# Patient Record
Sex: Female | Born: 1965 | Race: White | Hispanic: No | Marital: Single | Smoking: Never smoker
Health system: Southern US, Community
[De-identification: ages and names within clinical notes are randomized; demographics above are authoritative.]

## PROBLEM LIST (undated history)

## (undated) DIAGNOSIS — F32A Depression, unspecified: Secondary | ICD-10-CM

## (undated) DIAGNOSIS — F329 Major depressive disorder, single episode, unspecified: Secondary | ICD-10-CM

## (undated) DIAGNOSIS — B019 Varicella without complication: Secondary | ICD-10-CM

## (undated) HISTORY — DX: Depression, unspecified: F32.A

## (undated) HISTORY — DX: Varicella without complication: B01.9

## (undated) HISTORY — PX: TONSILLECTOMY: SUR1361

## (undated) HISTORY — DX: Major depressive disorder, single episode, unspecified: F32.9

## (undated) HISTORY — PX: WISDOM TOOTH EXTRACTION: SHX21

---

## 2016-04-08 ENCOUNTER — Encounter: Payer: Self-pay | Admitting: Family Medicine

## 2016-04-08 ENCOUNTER — Ambulatory Visit (INDEPENDENT_AMBULATORY_CARE_PROVIDER_SITE_OTHER): Payer: PRIVATE HEALTH INSURANCE | Admitting: Family Medicine

## 2016-04-08 VITALS — BP 130/86 | HR 81 | Temp 98.3°F | Ht 61.5 in | Wt 188.4 lb

## 2016-04-08 DIAGNOSIS — R200 Anesthesia of skin: Secondary | ICD-10-CM

## 2016-04-08 DIAGNOSIS — R202 Paresthesia of skin: Secondary | ICD-10-CM

## 2016-04-08 DIAGNOSIS — Z131 Encounter for screening for diabetes mellitus: Secondary | ICD-10-CM

## 2016-04-08 DIAGNOSIS — Z130 Encounter for screening for diseases of the blood and blood-forming organs and certain disorders involving the immune mechanism: Secondary | ICD-10-CM

## 2016-04-08 DIAGNOSIS — Z13220 Encounter for screening for lipoid disorders: Secondary | ICD-10-CM

## 2016-04-08 DIAGNOSIS — Z1329 Encounter for screening for other suspected endocrine disorder: Secondary | ICD-10-CM | POA: Diagnosis not present

## 2016-04-08 DIAGNOSIS — E663 Overweight: Secondary | ICD-10-CM

## 2016-04-08 DIAGNOSIS — Z7689 Persons encountering health services in other specified circumstances: Secondary | ICD-10-CM

## 2016-04-08 LAB — LIPID PANEL
CHOL/HDL RATIO: 4
Cholesterol: 181 mg/dL (ref 0–200)
HDL: 44.1 mg/dL (ref >39.00)
LDL CALC: 125 mg/dL — AB (ref 0–99)
NonHDL: 136.41
TRIGLYCERIDES: 59 mg/dL (ref 0.0–149.0)
VLDL: 11.8 mg/dL (ref 0.0–40.0)

## 2016-04-08 LAB — COMPREHENSIVE METABOLIC PANEL
ALT: 24 U/L (ref 0–35)
AST: 19 U/L (ref 0–37)
Albumin: 3.9 g/dL (ref 3.5–5.2)
Alkaline Phosphatase: 53 U/L (ref 39–117)
BUN: 16 mg/dL (ref 6–23)
CO2: 28 meq/L (ref 19–32)
Calcium: 8.9 mg/dL (ref 8.4–10.5)
Chloride: 104 mEq/L (ref 96–112)
Creatinine, Ser: 0.94 mg/dL (ref 0.40–1.20)
GFR: 67.59 mL/min (ref >60.00)
GLUCOSE: 95 mg/dL (ref 70–99)
POTASSIUM: 3.4 meq/L — AB (ref 3.5–5.1)
Sodium: 139 mEq/L (ref 135–145)
Total Bilirubin: 1 mg/dL (ref 0.2–1.2)
Total Protein: 6.9 g/dL (ref 6.0–8.3)

## 2016-04-08 LAB — CBC
HEMATOCRIT: 42.6 % (ref 36.0–46.0)
HEMOGLOBIN: 14.8 g/dL (ref 12.0–15.0)
MCHC: 34.7 g/dL (ref 30.0–36.0)
MCV: 84.8 fl (ref 78.0–100.0)
Platelets: 294 10*3/uL (ref 150.0–400.0)
RBC: 5.02 Mil/uL (ref 3.87–5.11)
RDW: 12.9 % (ref 11.5–15.5)
WBC: 6.4 10*3/uL (ref 4.0–10.5)

## 2016-04-08 LAB — HEMOGLOBIN A1C: Hgb A1c MFr Bld: 5.7 % (ref 4.6–6.5)

## 2016-04-08 LAB — TSH: TSH: 1.78 u[IU]/mL (ref 0.35–4.50)

## 2016-04-08 NOTE — Progress Notes (Signed)
[place] at [facility] [address] [place], [place] [address] [phone]  Date:  04/08/2016   Name:  Jenna Mitchell   DOB:  [birth year]   MRN:  [id number]  PCP:  Lamar Blinks, MD    Chief Complaint: Establish Care (Pt here to est care.)   History of Present Illness:  Jenna Mitchell is a 50 y.o. very pleasant female patient who presents with the following:  Generally healthy woman here today as a new patient to establish care She is from [place]- moved here for a relationship and then stayed in the area when the relationship ended She is in the insurance field.    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 She has thought about coming off of this medication but he OBG suggested that she continue to take it.  She tried prozac, lexapro, but has done the best on this  She sees Dr. Adella Nissen for her OBG care; however she also wanted to establish with a PCP  She thinks her last tetanus was as a child.    Her mother has had MS for over 30 years and is overall doing well.   In her free time she enjoys reading, and walking her dog.  She has a Tree surgeon who she walks most days.  No CP or SOB with exercise For exercise she enjoys walking.  However admits she probably does not get enough exercise and would like to get more  She last had labs last year per her OBG. She started a new diet about 1 month ago and has lost 11 lbs by eating low carb.  She is fasting today for labs.   She did have an episode about a year ago where she felt kind of dizzy and "just fell," this has not occurred again,  She is not really sure what happened and she did not have any LOC.  She is not sure if she tripped or otherwise just fell. 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  She is not  interested in surgery at this time but would like to try any conservative treatment as indicated  There are no active problems to display for this patient.   Past Medical History:  Diagnosis Date  . Chicken pox   . Depression     Past Surgical History:  Procedure Laterality Date  . TONSILLECTOMY    . WISDOM TOOTH EXTRACTION      Social History  Substance Use Topics  . Smoking status: Never Smoker  . Smokeless tobacco: Never Used  . Alcohol use Yes     Comment: social     Family History  Problem Relation Age of Onset  . Multiple sclerosis Mother   . Depression Mother   . Heart attack Father     No Known Allergies  Medication list has been reviewed and updated.  No current outpatient prescriptions on file prior to visit.   No current facility-administered medications on file prior to visit.     Review of Systems:  As per HPI- otherwise negative. She is left handed She does spend a lot of time on the computer No fever, chills, SOB, CP or rash   Physical Examination: Vitals:   04/08/16 0912  BP: 130/86  Pulse: 81  Temp: 98.3 F (36.8 C)   Vitals:   04/08/16 0912  Weight: 188 lb 6.4 oz (85.5 kg)  Height: 5' 1.5" (1.562 m)   Body mass index is 35.02 kg/m. Ideal Body Weight: Weight in (lb) to have BMI = 25: 134.2  GEN: WDWN, NAD, Non-toxic, A & O x 3, overweight, otherwise looks well HEENT: Atraumatic, Normocephalic. Neck supple. No masses, No LAD.  Bilateral TM wnl, oropharynx normal.  PEERL,EOMI.   Ears and Nose: No external deformity. CV: RRR, No M/G/R. No JVD. No thrill. No extra heart sounds. PULM: CTA B, no wheezes, crackles, rhonchi. No retractions. No resp. distress. No accessory muscle use. ABD: S, NT, ND EXTR: No c/c/e NEURO Normal gait.  PSYCH: Normally interactive. Conversant. Not depressed or anxious appearing.  Calm demeanor.  normal strength of both hands. Normal ROM of both hands.     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 Encouraged he to continue her diet and weight loss plan Declines flu and tdap today- did encourage her to get indicated immunizations   Signed Lamar Blinks, MD

## 2016-04-08 NOTE — Progress Notes (Signed)
Pre visit review using our clinic review tool, if applicable. No additional management support is needed unless otherwise documented below in the visit note. 

## 2016-04-08 NOTE — Patient Instructions (Signed)
It was very nice to meet you today- I'll be in touch with your labs as asap You might try a carpal tunnel wrist brace on your left wrist while you sleep- this can help to decrease symptoms of carpal tunnel I do encourage you to have a tetanus booster (for sure if you have any significant wound) and an annual flu shot  Take care and let's plan to recheck in 6-9 months

## 2016-10-06 NOTE — Progress Notes (Signed)
[place] at [facility] [address] [place], [place] [address] [phone]  Date:  10/07/2016   Name:  Jenna Mitchell   DOB:  [birth year]   MRN:  [id number]  PCP:  Lamar Blinks, MD    Chief Complaint: Annual Exam (Last PAP: 03/2016, pt has GYN. Pt is fasting for labs. Declined tetanus vaccine. )   History of Present Illness:  Jenna Mitchell is a 51 y.o. very pleasant female patient who presents with the following:  Last seen by myself in October with the following partial HPI:  Generally healthy woman here today as a new patient to establish care She is from [place]- moved here for a relationship and then stayed in the area when the relationship ended She is in the insurance field.   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 She has thought about coming off of this medication but he OBG suggested that she continue to take it.  She tried prozac, lexapro, but has done the best on this She sees Dr. Adella Nissen for her OBG care; however she also wanted to establish with a PCP She thinks her last tetanus was as a child.    Her mother has had MS for over 30 years and is overall doing well.   In her free time she enjoys reading, and walking her dog.  She has a Tree surgeon who she walks most days.  No CP or SOB with exercise For exercise she enjoys walking.  However admits she probably does not get enough exercise and would like to get more  She last had labs last year per her OBG. She started a new diet about 1 month ago and has lost 11 lbs by eating low carb.   She had labs in November including CBC, lipids, A1c, TSH mammo was last year.,  Her pap is also UTD She reports that she is doing well She joined [employer] and is enjoying this.  She feels like she is getting into better shape.  She has tried to stay low carb, but she notes that she is having loose stools after eating She  would like to check her A1c today She defers her tdap today Wt Readings from Last 3 Encounters:  10/07/16 193 lb 6.4 oz (87.7 kg)  04/08/16 188 lb 6.4 oz (85.5 kg)   BP Readings from Last 3 Encounters:  10/07/16 114/76  04/08/16 130/86   Her cymbalta is working well for her and she wishes to continue her current dose   Patient Active Problem List   Diagnosis Date Noted  . Overweight 04/08/2016    Past Medical History:  Diagnosis Date  . Chicken pox   . Depression     Past Surgical History:  Procedure Laterality Date  . TONSILLECTOMY    . WISDOM TOOTH EXTRACTION      Social History  Substance Use Topics  . Smoking status: Never Smoker  . Smokeless tobacco: Never Used  . Alcohol use Yes     Comment: social     Family History  Problem Relation Age of Onset  . Multiple sclerosis Mother   . Depression Mother   . Heart attack Father     No Known Allergies  Medication list has been reviewed and updated.  Current Outpatient Prescriptions on File Prior to Visit  Medication Sig Dispense Refill  . DULoxetine (CYMBALTA) 60 MG capsule Take 60 mg by  mouth daily.    Marland Kitchen levonorgestrel-ethinyl estradiol (AVIANE,ALESSE,LESSINA) 0.1-20 MG-MCG tablet Take 1 tablet by mouth daily.     No current facility-administered medications on file prior to visit.     Review of Systems:  As per HPI- otherwise negative. She notes that her throat often feels "clogged" and she is coughing up some material mostly in the morning.  She has noted this for over a year Never a smoker She has not noted any PND  No concerns about breast or shin changes   Physical Examination: Vitals:   10/07/16 0832  BP: 114/76  Pulse: 80  Temp: 98.2 F (36.8 C)   Vitals:   10/07/16 0832  Weight: 193 lb 6.4 oz (87.7 kg)  Height: 5' 1.5" (1.562 m)   Body mass index is 35.95 kg/m. Ideal Body Weight: Weight in (lb) to have BMI = 25: 134.2  GEN: WDWN, NAD, Non-toxic, A & O x 3, overweight, otherwise  looks well HEENT: Atraumatic, Normocephalic. Neck supple. No masses, No LAD. Bilateral TM wnl, oropharynx normal.  PEERL,EOMI.   Ears and Nose: No external deformity. CV: RRR, No M/G/R. No JVD. No thrill. No extra heart sounds. PULM: CTA B, no wheezes, crackles, rhonchi. No retractions. No resp. distress. No accessory muscle use. ABD: S, NT, ND. No rebound. No HSM. EXTR: No c/c/e NEURO Normal gait.  PSYCH: Normally interactive. Conversant. Not depressed or anxious appearing.  Calm demeanor.  No unusual appearing moles on the skin of her trunk   Assessment and Plan: Physical exam - Plan: Basic metabolic panel  Screening for diabetes mellitus - Plan: Hemoglobin A1c  Overweight  Here today for a CPE Discussed health maint for her today Encouraged her to continue exercising and eating a healthy diet Will repeat her A1c and BMP today   Signed Lamar Blinks, MD

## 2016-10-07 ENCOUNTER — Ambulatory Visit (INDEPENDENT_AMBULATORY_CARE_PROVIDER_SITE_OTHER): Payer: PRIVATE HEALTH INSURANCE | Admitting: Family Medicine

## 2016-10-07 ENCOUNTER — Encounter: Payer: Self-pay | Admitting: Family Medicine

## 2016-10-07 VITALS — BP 114/76 | HR 80 | Temp 98.2°F | Ht 61.5 in | Wt 193.4 lb

## 2016-10-07 DIAGNOSIS — E663 Overweight: Secondary | ICD-10-CM | POA: Diagnosis not present

## 2016-10-07 DIAGNOSIS — Z131 Encounter for screening for diabetes mellitus: Secondary | ICD-10-CM

## 2016-10-07 DIAGNOSIS — Z0000 Encounter for general adult medical examination without abnormal findings: Secondary | ICD-10-CM

## 2016-10-07 LAB — BASIC METABOLIC PANEL
BUN: 16 mg/dL (ref 6–23)
CHLORIDE: 108 meq/L (ref 96–112)
CO2: 26 meq/L (ref 19–32)
CREATININE: 0.98 mg/dL (ref 0.40–1.20)
Calcium: 9.1 mg/dL (ref 8.4–10.5)
GFR: 64.29 mL/min (ref >60.00)
Glucose, Bld: 103 mg/dL — ABNORMAL HIGH (ref 70–99)
POTASSIUM: 4.2 meq/L (ref 3.5–5.1)
Sodium: 141 mEq/L (ref 135–145)

## 2016-10-07 LAB — HEMOGLOBIN A1C: HEMOGLOBIN A1C: 5.9 % (ref 4.6–6.5)

## 2016-10-07 NOTE — Patient Instructions (Signed)
It was good to see you today!  Continue taking good care of yourself.  I will be in touch with your labs Remember that you are due for your tetanus shot- if you have any significant wound please get a booster asap Health Maintenance, Female Adopting a healthy lifestyle and getting preventive care can go a long way to promote health and wellness. Talk with your health care provider about what schedule of regular examinations is right for you. This is a good chance for you to check in with your provider about disease prevention and staying healthy. In between checkups, there are plenty of things you can do on your own. Experts have done a lot of research about which lifestyle changes and preventive measures are most likely to keep you healthy. Ask your health care provider for more information. Weight and diet Eat a healthy diet  Be sure to include plenty of vegetables, fruits, low-fat dairy products, and lean protein.  Do not eat a lot of foods high in solid fats, added sugars, or salt.  Get regular exercise. This is one of the most important things you can do for your health.  Most adults should exercise for at least 150 minutes each week. The exercise should increase your heart rate and make you sweat (moderate-intensity exercise).  Most adults should also do strengthening exercises at least twice a week. This is in addition to the moderate-intensity exercise. Maintain a healthy weight  Body mass index (BMI) is a measurement that can be used to identify possible weight problems. It estimates body fat based on height and weight. Your health care provider can help determine your BMI and help you achieve or maintain a healthy weight.  For females 56 years of age and older:  A BMI below 18.5 is considered underweight.  A BMI of 18.5 to 24.9 is normal.  A BMI of 25 to 29.9 is considered overweight.  A BMI of 30 and above is considered obese. Watch levels of cholesterol and blood  lipids  You should start having your blood tested for lipids and cholesterol at 49 years of age, then have this test every 5 years.  You may need to have your cholesterol levels checked more often if:  Your lipid or cholesterol levels are high.  You are older than 49 years of age.  You are at high risk for heart disease. Cancer screening Lung Cancer  Lung cancer screening is recommended for adults 47-26 years old who are at high risk for lung cancer because of a history of smoking.  A yearly low-dose CT scan of the lungs is recommended for people who:  Currently smoke.  Have quit within the past 15 years.  Have at least a 30-pack-year history of smoking. A pack year is smoking an average of one pack of cigarettes a day for 1 year.  Yearly screening should continue until it has been 15 years since you quit.  Yearly screening should stop if you develop a health problem that would prevent you from having lung cancer treatment. Breast Cancer  Practice breast self-awareness. This means understanding how your breasts normally appear and feel.  It also means doing regular breast self-exams. Let your health care provider know about any changes, no matter how small.  If you are in your 20s or 30s, you should have a clinical breast exam (CBE) by a health care provider every 1-3 years as part of a regular health exam.  If you are 40 or older, have  a CBE every year. Also consider having a breast X-ray (mammogram) every year.  If you have a family history of breast cancer, talk to your health care provider about genetic screening.  If you are at high risk for breast cancer, talk to your health care provider about having an MRI and a mammogram every year.  Breast cancer gene (BRCA) assessment is recommended for women who have family members with BRCA-related cancers. BRCA-related cancers include:  Breast.  Ovarian.  Tubal.  Peritoneal cancers.  Results of the assessment will  determine the need for genetic counseling and BRCA1 and BRCA2 testing. Cervical Cancer  Your health care provider may recommend that you be screened regularly for cancer of the pelvic organs (ovaries, uterus, and vagina). This screening involves a pelvic examination, including checking for microscopic changes to the surface of your cervix (Pap test). You may be encouraged to have this screening done every 3 years, beginning at age 55.  For women ages 17-65, health care providers may recommend pelvic exams and Pap testing every 3 years, or they may recommend the Pap and pelvic exam, combined with testing for human papilloma virus (HPV), every 5 years. Some types of HPV increase your risk of cervical cancer. Testing for HPV may also be done on women of any age with unclear Pap test results.  Other health care providers may not recommend any screening for nonpregnant women who are considered low risk for pelvic cancer and who do not have symptoms. Ask your health care provider if a screening pelvic exam is right for you.  If you have had past treatment for cervical cancer or a condition that could lead to cancer, you need Pap tests and screening for cancer for at least 20 years after your treatment. If Pap tests have been discontinued, your risk factors (such as having a new sexual partner) need to be reassessed to determine if screening should resume. Some women have medical problems that increase the chance of getting cervical cancer. In these cases, your health care provider may recommend more frequent screening and Pap tests. Colorectal Cancer  This type of cancer can be detected and often prevented.  Routine colorectal cancer screening usually begins at 49 years of age and continues through 49 years of age.  Your health care provider may recommend screening at an earlier age if you have risk factors for colon cancer.  Your health care provider may also recommend using home test kits to check for  hidden blood in the stool.  A small camera at the end of a tube can be used to examine your colon directly (sigmoidoscopy or colonoscopy). This is done to check for the earliest forms of colorectal cancer.  Routine screening usually begins at age 71.  Direct examination of the colon should be repeated every 5-10 years through 49 years of age. However, you may need to be screened more often if early forms of precancerous polyps or small growths are found. Skin Cancer  Check your skin from head to toe regularly.  Tell your health care provider about any new moles or changes in moles, especially if there is a change in a mole's shape or color.  Also tell your health care provider if you have a mole that is larger than the size of a pencil eraser.  Always use sunscreen. Apply sunscreen liberally and repeatedly throughout the day.  Protect yourself by wearing long sleeves, pants, a wide-brimmed hat, and sunglasses whenever you are outside. Heart disease, diabetes, and  high blood pressure  High blood pressure causes heart disease and increases the risk of stroke. High blood pressure is more likely to develop in:  People who have blood pressure in the high end of the normal range (130-139/85-89 mm Hg).  People who are overweight or obese.  People who are African American.  If you are 42-21 years of age, have your blood pressure checked every 3-5 years. If you are 68 years of age or older, have your blood pressure checked every year. You should have your blood pressure measured twice-once when you are at a hospital or clinic, and once when you are not at a hospital or clinic. Record the average of the two measurements. To check your blood pressure when you are not at a hospital or clinic, you can use:  An automated blood pressure machine at a pharmacy.  A home blood pressure monitor.  If you are between 6 years and 79 years old, ask your health care provider if you should take aspirin to  prevent strokes.  Have regular diabetes screenings. This involves taking a blood sample to check your fasting blood sugar level.  If you are at a normal weight and have a low risk for diabetes, have this test once every three years after 49 years of age.  If you are overweight and have a high risk for diabetes, consider being tested at a younger age or more often. Preventing infection Hepatitis B  If you have a higher risk for hepatitis B, you should be screened for this virus. You are considered at high risk for hepatitis B if:  You were born in a country where hepatitis B is common. Ask your health care provider which countries are considered high risk.  Your parents were born in a high-risk country, and you have not been immunized against hepatitis B (hepatitis B vaccine).  You have HIV or AIDS.  You use needles to inject street drugs.  You live with someone who has hepatitis B.  You have had sex with someone who has hepatitis B.  You get hemodialysis treatment.  You take certain medicines for conditions, including cancer, organ transplantation, and autoimmune conditions. Hepatitis C  Blood testing is recommended for:  Everyone born from [birth year] through 1963.  Anyone with known risk factors for hepatitis C. Sexually transmitted infections (STIs)  You should be screened for sexually transmitted infections (STIs) including gonorrhea and chlamydia if:  You are sexually active and are younger than 49 years of age.  You are older than 49 years of age and your health care provider tells you that you are at risk for this type of infection.  Your sexual activity has changed since you were last screened and you are at an increased risk for chlamydia or gonorrhea. Ask your health care provider if you are at risk.  If you do not have HIV, but are at risk, it may be recommended that you take a prescription medicine daily to prevent HIV infection. This is called pre-exposure  prophylaxis (PrEP). You are considered at risk if:  You are sexually active and do not regularly use condoms or know the HIV status of your partner(s).  You take drugs by injection.  You are sexually active with a partner who has HIV. Talk with your health care provider about whether you are at high risk of being infected with HIV. If you choose to begin PrEP, you should first be tested for HIV. You should then be tested every 3  months for as long as you are taking PrEP. Pregnancy  If you are premenopausal and you may become pregnant, ask your health care provider about preconception counseling.  If you may become pregnant, take 400 to 800 micrograms (mcg) of folic acid every day.  If you want to prevent pregnancy, talk to your health care provider about birth control (contraception). Osteoporosis and menopause  Osteoporosis is a disease in which the bones lose minerals and strength with aging. This can result in serious bone fractures. Your risk for osteoporosis can be identified using a bone density scan.  If you are 75 years of age or older, or if you are at risk for osteoporosis and fractures, ask your health care provider if you should be screened.  Ask your health care provider whether you should take a calcium or vitamin D supplement to lower your risk for osteoporosis.  Menopause may have certain physical symptoms and risks.  Hormone replacement therapy may reduce some of these symptoms and risks. Talk to your health care provider about whether hormone replacement therapy is right for you. Follow these instructions at home:  Schedule regular health, dental, and eye exams.  Stay current with your immunizations.  Do not use any tobacco products including cigarettes, chewing tobacco, or electronic cigarettes.  If you are pregnant, do not drink alcohol.  If you are breastfeeding, limit how much and how often you drink alcohol.  Limit alcohol intake to no more than 1 drink  per day for nonpregnant women. One drink equals 12 ounces of beer, 5 ounces of wine, or 1 ounces of hard liquor.  Do not use street drugs.  Do not share needles.  Ask your health care provider for help if you need support or information about quitting drugs.  Tell your health care provider if you often feel depressed.  Tell your health care provider if you have ever been abused or do not feel safe at home. This information is not intended to replace advice given to you by your health care provider. Make sure you discuss any questions you have with your health care provider. Document Released: 01/07/2011 Document Revised: 11/30/2015 Document Reviewed: 03/28/2015 Elsevier Interactive Patient Education  2017 [employer].

## 2017-03-18 NOTE — Progress Notes (Signed)
[facility] [address], [place], [place] [address] [phone]  Date:  03/19/2017   Name:  Jenna Mitchell   DOB:  [birth year]   MRN:  [id number]  PCP:  Darreld Mclean, MD    Chief Complaint: Rash (L and R arm, neck chin, stomach)   History of Present Illness:  Jenna Mitchell is a 51 y.o. very pleasant female patient who presents with the following:  Generally healthy patient here today with concern of   From our last visit in April of this year:  She had labs in November including CBC, lipids, A1c, TSH mammo was last year.,  Her pap is also UTD She reports that she is doing well She joined [employer] and is enjoying this.  She feels like she is getting into better shape.  She has tried to stay low carb, but she notes that she is having loose stools after eating She would like to check her A1c today She defers her tdap today  BP Readings from Last 3 Encounters:  03/19/17 (!) 130/93  10/07/16 114/76  04/08/16 130/86   Pulse Readings from Last 3 Encounters:  03/19/17 (!) 108  10/07/16 80  04/08/16 81   A tree fell in their neighborhood and she helped to clear it out- about a week later she began to break out in likely rhus derm on her arms, legs and abdomen.  This has continued to get worse and is now pretty severe on on her left arm.  She is using benadryl, otc topicals but is not getting relief.  She has never reacted to PI like this in the past No fever, chills or other systemic sx  History of pre-diabetes but not diabetes LMP 8/29  Pt also notes that at times she will feel very hot and then very cold.  A friend who has lupus is concerned that she may have lupus also.  Pt is interested in getting some lab eval in this regard.  Never had a malar rash   Patient Active Problem List   Diagnosis Date Noted  . Overweight 04/08/2016    Past Medical History:  Diagnosis Date  . Chicken pox   .  Depression     Past Surgical History:  Procedure Laterality Date  . TONSILLECTOMY    . WISDOM TOOTH EXTRACTION      Social History  Substance Use Topics  . Smoking status: Never Smoker  . Smokeless tobacco: Never Used  . Alcohol use Yes     Comment: social     Family History  Problem Relation Age of Onset  . Multiple sclerosis Mother   . Depression Mother   . Heart attack Father     No Known Allergies  Medication list has been reviewed and updated.  Current Outpatient Prescriptions on File Prior to Visit  Medication Sig Dispense Refill  . Cholecalciferol (VITAMIN D-3) 5000 units TABS Take 1 tablet by mouth daily.    . DULoxetine (CYMBALTA) 60 MG capsule Take 60 mg by mouth daily.    Marland Kitchen levonorgestrel-ethinyl estradiol (AVIANE,ALESSE,LESSINA) 0.1-20 MG-MCG tablet Take 1 tablet by mouth daily.    . Potassium 99 MG TABS Take 1 tablet by mouth daily.    . Turmeric 500 MG TABS Take 2 tablets by mouth daily.     No current facility-administered medications on file prior to visit.     Review of Systems:  As per HPI- otherwise  negative. No CP or SOB No lip or tongue swelling, no SOB    Physical Examination: Vitals:   03/19/17 1559  BP: (!) 130/93  Pulse: (!) 108  Temp: 98.9 F (37.2 C)  SpO2: 98%   Vitals:   03/19/17 1559  Weight: 197 lb 9.6 oz (89.6 kg)  Height: 5' 1.5" (1.562 m)   Body mass index is 36.73 kg/m. Ideal Body Weight: Weight in (lb) to have BMI = 25: 134.2  GEN: WDWN, NAD, Non-toxic, A & O x 3, obese, otherwise looks well HEENT: Atraumatic, Normocephalic. Neck supple. No masses, No LAD.  No oral lesions Ears and Nose: No external deformity. CV: RRR, No M/G/R. No JVD. No thrill. No extra heart sounds. PULM: CTA B, no wheezes, crackles, rhonchi. No retractions. No resp. distress. No accessory muscle use. ABD: S, NT, ND, +BS. No rebound. No HSM. EXTR: No c/c/e NEURO Normal gait.  PSYCH: Normally interactive. Conversant. Not depressed or  anxious appearing.  Calm demeanor.  Rash c/w rhus derm worst on her left arm, but also in scattered patches on her right arm, right leg, and trunk   Assessment and Plan: Rhus dermatitis - Plan: predniSONE (DELTASONE) 20 MG tablet  Screening for deficiency anemia - Plan: CBC  Screening for diabetes mellitus - Plan: Comprehensive metabolic panel, Hemoglobin A1c  Fatigue, unspecified type - Plan: Sedimentation rate, C-reactive protein, TSH  Night sweats - Plan: Sedimentation rate, C-reactive protein, ANA  Moderate rhus derm.  Pt would like to use oral prednisone which is reasonable in this case  She will continue benadryl and topicals as needed and follow-up if not improving soon Also ordered labs as above for her to do once she has completed her prednisone treatment  Signed Lamar Blinks, MD

## 2017-03-19 ENCOUNTER — Encounter: Payer: Self-pay | Admitting: Family Medicine

## 2017-03-19 ENCOUNTER — Ambulatory Visit (INDEPENDENT_AMBULATORY_CARE_PROVIDER_SITE_OTHER): Payer: PRIVATE HEALTH INSURANCE | Admitting: Family Medicine

## 2017-03-19 VITALS — BP 130/93 | HR 90 | Temp 98.9°F | Ht 61.5 in | Wt 197.6 lb

## 2017-03-19 DIAGNOSIS — R5383 Other fatigue: Secondary | ICD-10-CM

## 2017-03-19 DIAGNOSIS — Z130 Encounter for screening for diseases of the blood and blood-forming organs and certain disorders involving the immune mechanism: Secondary | ICD-10-CM

## 2017-03-19 DIAGNOSIS — R61 Generalized hyperhidrosis: Secondary | ICD-10-CM

## 2017-03-19 DIAGNOSIS — Z131 Encounter for screening for diabetes mellitus: Secondary | ICD-10-CM

## 2017-03-19 DIAGNOSIS — L237 Allergic contact dermatitis due to plants, except food: Secondary | ICD-10-CM | POA: Diagnosis not present

## 2017-03-19 DIAGNOSIS — L255 Unspecified contact dermatitis due to plants, except food: Secondary | ICD-10-CM

## 2017-03-19 MED ORDER — PREDNISONE 20 MG PO TABS: ORAL_TABLET | ORAL | 0 refills | Status: DC

## 2017-03-19 NOTE — Patient Instructions (Signed)
Use the prednisone as directed for your rash- I hope that it is better very soon!  Please come in for labs at your convenience and I will look for any sign of an autoimmune disorder

## 2017-04-29 NOTE — Progress Notes (Addendum)
[place] [facility] [address] [place], [place] [address] [phone]  Date:  05/01/2017   Name:  Jenna Mitchell   DOB:  26-Nov-1965   MRN:  [id number]  PCP:  Darreld Mclean, MD    Chief Complaint: Annual Exam (Pt here for CPE. Declined flu vaccine)   History of Present Illness:  Jenna Mitchell is a 51 y.o. very pleasant female patient who presents with the following:  Here today for a CPE Last seen here last month with skin rash- she had PI on her skin  We ordered labs but she did not end up coming in to have them done- can do for her today  Pap:9/17, her GYN moved away. She did have an abnl several years ago but ok since Mammo: last year- at GYN office  Flu: declines today Tetanus: last done as a child, but she declines today  She is fasting today for labs   She came off her OCP this month- she did lose a few lbs since she stopped this.  She does not need contraception and thinks she may be in menopause anyway, she wants to see if she will get menses or not   She is doing well on the cymbalta- her mood is not that great, but she feels that she is doing ok and does not want to change the dose or medication She canceled her [employer] as she has felt so tired recently Over the last week she notes that she has felt really exhausted and is not sure why  She also states that about a week ago she got up out of her chair at work, started to walk across the room and suddenly fell to the ground.  She does not think she tripped and does not think she lost consciousness.  She says this sort of thing did happen once before about 2 years ago.  She has felt ok since this incident and is not sure what happened   She also notes that she has the feeling of something being caught in the back of her throat chronically. And that she will sometimes cough up some discolored mucus  No CP or palpitations No history of CAD in  herself Never a smoker  She also notes excessive sweating  Patient Active Problem List   Diagnosis Date Noted  . Overweight 04/08/2016    Past Medical History:  Diagnosis Date  . Chicken pox   . Depression     Past Surgical History:  Procedure Laterality Date  . TONSILLECTOMY    . WISDOM TOOTH EXTRACTION      Social History  Substance Use Topics  . Smoking status: Never Smoker  . Smokeless tobacco: Never Used  . Alcohol use Yes     Comment: social     Family History  Problem Relation Age of Onset  . Multiple sclerosis Mother   . Depression Mother   . Heart attack Father     No Known Allergies  Medication list has been reviewed and updated.  Current Outpatient Prescriptions on File Prior to Visit  Medication Sig Dispense Refill  . DULoxetine (CYMBALTA) 60 MG capsule Take 60 mg by mouth daily.    . Potassium 99 MG TABS Take 1 tablet by mouth daily.     No current facility-administered medications on file prior to visit.     Review of Systems:  As per HPI- otherwise negative.   Physical  Examination: Vitals:   05/01/17 1513  BP: 119/87  Pulse: 98  Temp: (!) 97.5 F (36.4 C)  SpO2: 99%   Vitals:   05/01/17 1513  Weight: 198 lb (89.8 kg)  Height: 5' 1.5" (1.562 m)   Body mass index is 36.81 kg/m. Ideal Body Weight: Weight in (lb) to have BMI = 25: 134.2  GEN: WDWN, NAD, Non-toxic, A & O x 3, obese, looks well HEENT: Atraumatic, Normocephalic. Neck supple. No masses, No LAD.  Bilateral TM wnl, oropharynx normal.  PEERL,EOMI.   Ears and Nose: No external deformity. CV: RRR, No M/G/R. No JVD. No thrill. No extra heart sounds. PULM: CTA B, no wheezes, crackles, rhonchi. No retractions. No resp. distress. No accessory muscle use. ABD: S, NT, ND, +BS. No rebound. No HSM. EXTR: No c/c/e NEURO Normal gait.  PSYCH: Normally interactive. Conversant. Not depressed or anxious appearing.  Calm demeanor.   EKG: SR, q waves in precordial leads  no old EKG  for comparison Assessment and Plan: Physical exam  Screening for deficiency anemia - Plan: CBC  Screening for hyperlipidemia - Plan: Lipid panel  Pre-diabetes - Plan: Comprehensive metabolic panel, Hemoglobin A1c  Vitamin D deficiency - Plan: Vitamin D (25 hydroxy)  Weight loss - Plan: TSH  Fall, initial encounter - Plan: TSH, EKG 12-Lead  Chronic fatigue  Post-nasal drip - Plan: ipratropium (ATROVENT) 0.03 % nasal spray  Transaminitis - Plan: US Abdomen Limited RUQ, Hepatic Function Panel  Collapse - Plan: EKG 12-Lead  Here today for a CPE Labs pending as above Declines flu and tetanus vaccines Unexplained fall or collapse a week ago- discussed with pt.  This may have been a totally benign incident, or could possibly represent a significant cardiac or neurologic event Right now she declines further work- up but will let me know if this happens again Explained that her EKG is not normal, but we cannot tell if this is old or new.  Offered further cardiology evaluation but she declines for now.  EKG however does not show an arrhythmia which would have caused a collapse  Encouraged a sleep study due to persistent fatigue- she will think about this Trial of atrovent nasal for her Will plan further follow- up pending labs.  Signed Lamar Blinks, MD  Received her labs 10/28- message to pt  Your blood count is overall ok- red blood cell counts are minimally high. This is unlikely to be harmful, but could possibly increase your risk of a blood clot.  Donating blood at the [facility] is a great way to get rid of "extra" red cells!  Otherwise we will keep an eye on this Metabolic profile is normal except your liver function tests are minimally high.  This is new since last year.  I suspect this may be due to fat in the liver- I will order an ultrasound to look at your liver.  In the meantime, please make sure you are not drinking more alcohol than usual, and avoid taking tylenol for  aches and pains.  In addition to the ultrasound, we will plan to get repeat liver tests for you int 2-3 weeks, as a lab visit only.  Please come in to have your blood drawn at your convenience.  Your total and LDL cholesterol numbers are high, and your A1c (average blood sugar over 3 months) is in the pre-diabetes range.  Please work on losing weight gradually- about 1 lb per week is great- to improve these numbers and prevent onset  of diabetes.   Vitamin D and thyroid are normal  Things to do 1. Ultrasound- I will order 2. Repeat liver tests- please come in for labs only in 2-3 weeks 3. Follow-up in 4-6 months in the office 4. Work on gradual weight loss  Results for orders placed or performed in visit on 05/01/17  CBC  Result Value Ref Range   WBC 9.8 4.0 - 10.5 K/uL   RBC 5.16 (H) 3.87 - 5.11 Mil/uL   Platelets 309.0 150.0 - 400.0 K/uL   Hemoglobin 15.1 (H) 12.0 - 15.0 g/dL   HCT 46.3 (H) 36.0 - 46.0 %   MCV 89.6 78.0 - 100.0 fl   MCHC 32.6 30.0 - 36.0 g/dL   RDW 13.1 11.5 - 15.5 %  Comprehensive metabolic panel  Result Value Ref Range   Sodium 144 135 - 145 mEq/L   Potassium 4.3 3.5 - 5.1 mEq/L   Chloride 102 96 - 112 mEq/L   CO2 31 19 - 32 mEq/L   Glucose, Bld 92 70 - 99 mg/dL   BUN 11 6 - 23 mg/dL   Creatinine, Ser 0.92 0.40 - 1.20 mg/dL   Total Bilirubin 0.8 0.2 - 1.2 mg/dL   Alkaline Phosphatase 55 39 - 117 U/L   AST 45 (H) 0 - 37 U/L   ALT 75 (H) 0 - 35 U/L   Total Protein 7.2 6.0 - 8.3 g/dL   Albumin 4.5 3.5 - 5.2 g/dL   Calcium 9.9 8.4 - 10.5 mg/dL   GFR 68.98 >60.00 mL/min  Lipid panel  Result Value Ref Range   Cholesterol 234 (H) 0 - 200 mg/dL   Triglycerides 123.0 0.0 - 149.0 mg/dL   HDL 56.60 >39.00 mg/dL   VLDL 24.6 0.0 - 40.0 mg/dL   LDL Cholesterol 152 (H) 0 - 99 mg/dL   Total CHOL/HDL Ratio 4    NonHDL 177.06   Hemoglobin A1c  Result Value Ref Range   Hgb A1c MFr Bld 6.1 4.6 - 6.5 %  Vitamin D (25 hydroxy)  Result Value Ref Range   VITD 48.32  30.00 - 100.00 ng/mL  TSH  Result Value Ref Range   TSH 2.14 0.35 - 4.50 uIU/mL

## 2017-05-01 ENCOUNTER — Ambulatory Visit (INDEPENDENT_AMBULATORY_CARE_PROVIDER_SITE_OTHER): Payer: PRIVATE HEALTH INSURANCE | Admitting: Family Medicine

## 2017-05-01 VITALS — BP 119/87 | HR 98 | Temp 97.5°F | Ht 61.5 in | Wt 198.0 lb

## 2017-05-01 DIAGNOSIS — E559 Vitamin D deficiency, unspecified: Secondary | ICD-10-CM | POA: Diagnosis not present

## 2017-05-01 DIAGNOSIS — R5382 Chronic fatigue, unspecified: Secondary | ICD-10-CM | POA: Diagnosis not present

## 2017-05-01 DIAGNOSIS — R740 Nonspecific elevation of levels of transaminase and lactic acid dehydrogenase [LDH]: Secondary | ICD-10-CM

## 2017-05-01 DIAGNOSIS — R55 Syncope and collapse: Secondary | ICD-10-CM | POA: Diagnosis not present

## 2017-05-01 DIAGNOSIS — R7303 Prediabetes: Secondary | ICD-10-CM | POA: Diagnosis not present

## 2017-05-01 DIAGNOSIS — Z13220 Encounter for screening for lipoid disorders: Secondary | ICD-10-CM

## 2017-05-01 DIAGNOSIS — R634 Abnormal weight loss: Secondary | ICD-10-CM

## 2017-05-01 DIAGNOSIS — Z130 Encounter for screening for diseases of the blood and blood-forming organs and certain disorders involving the immune mechanism: Secondary | ICD-10-CM | POA: Diagnosis not present

## 2017-05-01 DIAGNOSIS — W19XXXA Unspecified fall, initial encounter: Secondary | ICD-10-CM | POA: Diagnosis not present

## 2017-05-01 DIAGNOSIS — R0982 Postnasal drip: Secondary | ICD-10-CM | POA: Diagnosis not present

## 2017-05-01 DIAGNOSIS — Z0000 Encounter for general adult medical examination without abnormal findings: Secondary | ICD-10-CM | POA: Diagnosis not present

## 2017-05-01 DIAGNOSIS — R7401 Elevation of levels of liver transaminase levels: Secondary | ICD-10-CM

## 2017-05-01 MED ORDER — IPRATROPIUM BROMIDE 0.03 % NA SOLN: 2.0000 | Freq: Four times a day (QID) | NASAL | 6 refills | Status: DC

## 2017-05-01 NOTE — Patient Instructions (Addendum)
Please get your mammogram done soon, please ask them to send me the result Please alert me if you have any more attacks of falling down Please check on your insurance coverage of sleep study and let me know if this is something you would like to pursue further I will be in touch with your labs asap I gave you a nasal spray called atrovent- use up to 4x a day for post nasal drainage. Please let me know if this does not help with the feeling of something stuck in your throat and the coughing   Health Maintenance, Female Adopting a healthy lifestyle and getting preventive care can go a long way to promote health and wellness. Talk with your health care provider about what schedule of regular examinations is right for you. This is a good chance for you to check in with your provider about disease prevention and staying healthy. In between checkups, there are plenty of things you can do on your own. Experts have done a lot of research about which lifestyle changes and preventive measures are most likely to keep you healthy. Ask your health care provider for more information. Weight and diet Eat a healthy diet  Be sure to include plenty of vegetables, fruits, low-fat dairy products, and lean protein.  Do not eat a lot of foods high in solid fats, added sugars, or salt.  Get regular exercise. This is one of the most important things you can do for your health. ? Most adults should exercise for at least 150 minutes each week. The exercise should increase your heart rate and make you sweat (moderate-intensity exercise). ? Most adults should also do strengthening exercises at least twice a week. This is in addition to the moderate-intensity exercise.  Maintain a healthy weight  Body mass index (BMI) is a measurement that can be used to identify possible weight problems. It estimates body fat based on height and weight. Your health care provider can help determine your BMI and help you achieve or maintain  a healthy weight.  For females 21 years of age and older: ? A BMI below 18.5 is considered underweight. ? A BMI of 18.5 to 24.9 is normal. ? A BMI of 25 to 29.9 is considered overweight. ? A BMI of 30 and above is considered obese.  Watch levels of cholesterol and blood lipids  You should start having your blood tested for lipids and cholesterol at 49 years of age, then have this test every 5 years.  You may need to have your cholesterol levels checked more often if: ? Your lipid or cholesterol levels are high. ? You are older than 49 years of age. ? You are at high risk for heart disease.  Cancer screening Lung Cancer  Lung cancer screening is recommended for adults 45-45 years old who are at high risk for lung cancer because of a history of smoking.  A yearly low-dose CT scan of the lungs is recommended for people who: ? Currently smoke. ? Have quit within the past 15 years. ? Have at least a 30-pack-year history of smoking. A pack year is smoking an average of one pack of cigarettes a day for 1 year.  Yearly screening should continue until it has been 15 years since you quit.  Yearly screening should stop if you develop a health problem that would prevent you from having lung cancer treatment.  Breast Cancer  Practice breast self-awareness. This means understanding how your breasts normally appear and feel.  It also means doing regular breast self-exams. Let your health care provider know about any changes, no matter how small.  If you are in your 20s or 30s, you should have a clinical breast exam (CBE) by a health care provider every 1-3 years as part of a regular health exam.  If you are 76 or older, have a CBE every year. Also consider having a breast X-ray (mammogram) every year.  If you have a family history of breast cancer, talk to your health care provider about genetic screening.  If you are at high risk for breast cancer, talk to your health care provider about  having an MRI and a mammogram every year.  Breast cancer gene (BRCA) assessment is recommended for women who have family members with BRCA-related cancers. BRCA-related cancers include: ? Breast. ? Ovarian. ? Tubal. ? Peritoneal cancers.  Results of the assessment will determine the need for genetic counseling and BRCA1 and BRCA2 testing.  Cervical Cancer Your health care provider may recommend that you be screened regularly for cancer of the pelvic organs (ovaries, uterus, and vagina). This screening involves a pelvic examination, including checking for microscopic changes to the surface of your cervix (Pap test). You may be encouraged to have this screening done every 3 years, beginning at age 74.  For women ages 15-65, health care providers may recommend pelvic exams and Pap testing every 3 years, or they may recommend the Pap and pelvic exam, combined with testing for human papilloma virus (HPV), every 5 years. Some types of HPV increase your risk of cervical cancer. Testing for HPV may also be done on women of any age with unclear Pap test results.  Other health care providers may not recommend any screening for nonpregnant women who are considered low risk for pelvic cancer and who do not have symptoms. Ask your health care provider if a screening pelvic exam is right for you.  If you have had past treatment for cervical cancer or a condition that could lead to cancer, you need Pap tests and screening for cancer for at least 20 years after your treatment. If Pap tests have been discontinued, your risk factors (such as having a new sexual partner) need to be reassessed to determine if screening should resume. Some women have medical problems that increase the chance of getting cervical cancer. In these cases, your health care provider may recommend more frequent screening and Pap tests.  Colorectal Cancer  This type of cancer can be detected and often prevented.  Routine colorectal cancer  screening usually begins at 49 years of age and continues through 49 years of age.  Your health care provider may recommend screening at an earlier age if you have risk factors for colon cancer.  Your health care provider may also recommend using home test kits to check for hidden blood in the stool.  A small camera at the end of a tube can be used to examine your colon directly (sigmoidoscopy or colonoscopy). This is done to check for the earliest forms of colorectal cancer.  Routine screening usually begins at age 28.  Direct examination of the colon should be repeated every 5-10 years through 49 years of age. However, you may need to be screened more often if early forms of precancerous polyps or small growths are found.  Skin Cancer  Check your skin from head to toe regularly.  Tell your health care provider about any new moles or changes in moles, especially if there is a  change in a mole's shape or color.  Also tell your health care provider if you have a mole that is larger than the size of a pencil eraser.  Always use sunscreen. Apply sunscreen liberally and repeatedly throughout the day.  Protect yourself by wearing long sleeves, pants, a wide-brimmed hat, and sunglasses whenever you are outside.  Heart disease, diabetes, and high blood pressure  High blood pressure causes heart disease and increases the risk of stroke. High blood pressure is more likely to develop in: ? People who have blood pressure in the high end of the normal range (130-139/85-89 mm Hg). ? People who are overweight or obese. ? People who are African American.  If you are 30-45 years of age, have your blood pressure checked every 3-5 years. If you are 52 years of age or older, have your blood pressure checked every year. You should have your blood pressure measured twice-once when you are at a hospital or clinic, and once when you are not at a hospital or clinic. Record the average of the two measurements.  To check your blood pressure when you are not at a hospital or clinic, you can use: ? An automated blood pressure machine at a pharmacy. ? A home blood pressure monitor.  If you are between 34 years and 47 years old, ask your health care provider if you should take aspirin to prevent strokes.  Have regular diabetes screenings. This involves taking a blood sample to check your fasting blood sugar level. ? If you are at a normal weight and have a low risk for diabetes, have this test once every three years after 49 years of age. ? If you are overweight and have a high risk for diabetes, consider being tested at a younger age or more often. Preventing infection Hepatitis B  If you have a higher risk for hepatitis B, you should be screened for this virus. You are considered at high risk for hepatitis B if: ? You were born in a country where hepatitis B is common. Ask your health care provider which countries are considered high risk. ? Your parents were born in a high-risk country, and you have not been immunized against hepatitis B (hepatitis B vaccine). ? You have HIV or AIDS. ? You use needles to inject street drugs. ? You live with someone who has hepatitis B. ? You have had sex with someone who has hepatitis B. ? You get hemodialysis treatment. ? You take certain medicines for conditions, including cancer, organ transplantation, and autoimmune conditions.  Hepatitis C  Blood testing is recommended for: ? Everyone born from [birth year] through 1963. ? Anyone with known risk factors for hepatitis C.  Sexually transmitted infections (STIs)  You should be screened for sexually transmitted infections (STIs) including gonorrhea and chlamydia if: ? You are sexually active and are younger than 49 years of age. ? You are older than 49 years of age and your health care provider tells you that you are at risk for this type of infection. ? Your sexual activity has changed since you were last screened  and you are at an increased risk for chlamydia or gonorrhea. Ask your health care provider if you are at risk.  If you do not have HIV, but are at risk, it may be recommended that you take a prescription medicine daily to prevent HIV infection. This is called pre-exposure prophylaxis (PrEP). You are considered at risk if: ? You are sexually active and do not regularly  use condoms or know the HIV status of your partner(s). ? You take drugs by injection. ? You are sexually active with a partner who has HIV.  Talk with your health care provider about whether you are at high risk of being infected with HIV. If you choose to begin PrEP, you should first be tested for HIV. You should then be tested every 3 months for as long as you are taking PrEP. Pregnancy  If you are premenopausal and you may become pregnant, ask your health care provider about preconception counseling.  If you may become pregnant, take 400 to 800 micrograms (mcg) of folic acid every day.  If you want to prevent pregnancy, talk to your health care provider about birth control (contraception). Osteoporosis and menopause  Osteoporosis is a disease in which the bones lose minerals and strength with aging. This can result in serious bone fractures. Your risk for osteoporosis can be identified using a bone density scan.  If you are 23 years of age or older, or if you are at risk for osteoporosis and fractures, ask your health care provider if you should be screened.  Ask your health care provider whether you should take a calcium or vitamin D supplement to lower your risk for osteoporosis.  Menopause may have certain physical symptoms and risks.  Hormone replacement therapy may reduce some of these symptoms and risks. Talk to your health care provider about whether hormone replacement therapy is right for you. Follow these instructions at home:  Schedule regular health, dental, and eye exams.  Stay current with your  immunizations.  Do not use any tobacco products including cigarettes, chewing tobacco, or electronic cigarettes.  If you are pregnant, do not drink alcohol.  If you are breastfeeding, limit how much and how often you drink alcohol.  Limit alcohol intake to no more than 1 drink per day for nonpregnant women. One drink equals 12 ounces of beer, 5 ounces of wine, or 1 ounces of hard liquor.  Do not use street drugs.  Do not share needles.  Ask your health care provider for help if you need support or information about quitting drugs.  Tell your health care provider if you often feel depressed.  Tell your health care provider if you have ever been abused or do not feel safe at home. This information is not intended to replace advice given to you by your health care provider. Make sure you discuss any questions you have with your health care provider. Document Released: 01/07/2011 Document Revised: 11/30/2015 Document Reviewed: 03/28/2015 Elsevier Interactive Patient Education  [employer].

## 2017-05-02 LAB — COMPREHENSIVE METABOLIC PANEL
ALBUMIN: 4.5 g/dL (ref 3.5–5.2)
ALK PHOS: 55 U/L (ref 39–117)
ALT: 75 U/L — ABNORMAL HIGH (ref 0–35)
AST: 45 U/L — AB (ref 0–37)
BILIRUBIN TOTAL: 0.8 mg/dL (ref 0.2–1.2)
BUN: 11 mg/dL (ref 6–23)
CO2: 31 mEq/L (ref 19–32)
CREATININE: 0.92 mg/dL (ref 0.40–1.20)
Calcium: 9.9 mg/dL (ref 8.4–10.5)
Chloride: 102 mEq/L (ref 96–112)
GFR: 68.98 mL/min (ref >60.00)
Glucose, Bld: 92 mg/dL (ref 70–99)
Potassium: 4.3 mEq/L (ref 3.5–5.1)
SODIUM: 144 meq/L (ref 135–145)
TOTAL PROTEIN: 7.2 g/dL (ref 6.0–8.3)

## 2017-05-02 LAB — LIPID PANEL
CHOLESTEROL: 234 mg/dL — AB (ref 0–200)
HDL: 56.6 mg/dL (ref >39.00)
LDL Cholesterol: 152 mg/dL — ABNORMAL HIGH (ref 0–99)
NONHDL: 177.06
Total CHOL/HDL Ratio: 4
Triglycerides: 123 mg/dL (ref 0.0–149.0)
VLDL: 24.6 mg/dL (ref 0.0–40.0)

## 2017-05-02 LAB — CBC
HCT: 46.3 % — ABNORMAL HIGH (ref 36.0–46.0)
Hemoglobin: 15.1 g/dL — ABNORMAL HIGH (ref 12.0–15.0)
MCHC: 32.6 g/dL (ref 30.0–36.0)
MCV: 89.6 fl (ref 78.0–100.0)
PLATELETS: 309 10*3/uL (ref 150.0–400.0)
RBC: 5.16 Mil/uL — AB (ref 3.87–5.11)
RDW: 13.1 % (ref 11.5–15.5)
WBC: 9.8 10*3/uL (ref 4.0–10.5)

## 2017-05-02 LAB — TSH: TSH: 2.14 u[IU]/mL (ref 0.35–4.50)

## 2017-05-02 LAB — HEMOGLOBIN A1C: HEMOGLOBIN A1C: 6.1 % (ref 4.6–6.5)

## 2017-05-02 LAB — VITAMIN D 25 HYDROXY (VIT D DEFICIENCY, FRACTURES): VITD: 48.32 ng/mL (ref 30.00–100.00)

## 2017-05-04 ENCOUNTER — Encounter: Payer: Self-pay | Admitting: Family Medicine

## 2017-05-04 NOTE — Addendum Note (Signed)
Addended by: Lamar Blinks C on: 05/04/2017 07:20 AM   Modules accepted: Orders

## 2017-05-16 ENCOUNTER — Ambulatory Visit (HOSPITAL_BASED_OUTPATIENT_CLINIC_OR_DEPARTMENT_OTHER)
Admission: RE | Admit: 2017-05-16 | Discharge: 2017-05-16 | Disposition: A | Discharge status: Home / Self Care | Payer: No Typology Code available for payment source | Source: Ambulatory Visit | Attending: Family Medicine | Admitting: Family Medicine

## 2017-05-16 DIAGNOSIS — R7401 Elevation of levels of liver transaminase levels: Secondary | ICD-10-CM

## 2017-05-16 DIAGNOSIS — R932 Abnormal findings on diagnostic imaging of liver and biliary tract: Secondary | ICD-10-CM | POA: Insufficient documentation

## 2017-05-16 DIAGNOSIS — R740 Nonspecific elevation of levels of transaminase and lactic acid dehydrogenase [LDH]: Secondary | ICD-10-CM | POA: Insufficient documentation

## 2017-05-17 ENCOUNTER — Encounter: Payer: Self-pay | Admitting: Family Medicine

## 2017-05-17 ENCOUNTER — Other Ambulatory Visit: Payer: Self-pay | Admitting: Family Medicine

## 2017-05-17 DIAGNOSIS — R945 Abnormal results of liver function studies: Secondary | ICD-10-CM

## 2017-05-17 DIAGNOSIS — R7989 Other specified abnormal findings of blood chemistry: Secondary | ICD-10-CM

## 2017-05-20 NOTE — Telephone Encounter (Signed)
-----   Message from Synthia Innocent sent at 05/19/2017 10:49 AM EST ----- Regarding: FW: [place] ABD Please advise ----- Message ----- From: Katha Hamming Sent: 05/19/2017  10:33 AM To: Synthia Innocent Subject: [place] ABD                                         This is a 3 to 6 month follow-up on this patient.  Can you find out when the doctor wants it?   I didn't see it in the notes.    Thanks, Hoyle Sauer

## 2017-09-01 ENCOUNTER — Encounter: Payer: Self-pay | Admitting: Family Medicine

## 2017-09-02 MED ORDER — DULOXETINE HCL 60 MG PO CPEP: 60.0000 mg | ORAL_CAPSULE | Freq: Every day | ORAL | 1 refills | Status: DC

## 2017-09-17 NOTE — Progress Notes (Addendum)
[place] [facility] [address], [place], [place] [address] [phone]  Date:  09/18/2017   Name:  Jenna Mitchell   DOB:  17-Apr-1966   MRN:  [id number]  PCP:  Darreld Mclean, MD    Chief Complaint: Diarrhea (c/o diarrhea, bloating, cramping and fatigue. Pt states sx's have been present for a few years but worse over the past few months. )   History of Present Illness:  Jenna Mitchell is a 52 y.o. very pleasant female patient who presents with the following:  I last saw her in October: Here today for a CPE Labs pending as above Declines flu and tetanus vaccines Unexplained fall or collapse a week ago- discussed with pt.  This may have been a totally benign incident, or could possibly represent a significant cardiac or neurologic event Right now she declines further work- up but will let me know if this happens again Explained that her EKG is not normal, but we cannot tell if this is old or new.  Offered further cardiology evaluation but she declines for now.  EKG however does not show an arrhythmia which would have caused a collapse  Encouraged a sleep study due to persistent fatigue- she will think about this Trial of atrovent nasal for her Will plan further follow- up pending labs.  We are following her LFTs and CBC- recheck these today  She has always had a sensitive stomach- however diarrhea and unreliable bowels has gotten worse the last year or so. She has even had a couple of accidents when she was out She notes that she will have no warning, but will just all of a sudden have a BM No blood in her stool No pain with BM Eating will trigger a BM, so she avoids eating away from home No vomiting She is having more gas  She has noted some headaches, she has tried excedrin for this prn.  She is using this just once in a while  She has not tried any medications OTC for her sx as of yet   She tried eliminating lactose  but it did not help She has tried other dietary modifications but this did not help   No family history of Crohn disease or UC  She did have a colonoscopy at some point for similar sx, but this was 25 years ago- it was ok per her report  RUQ [place] from November: IMPRESSION: 1. Overall, the liver demonstrates increased echogenicity is nonspecific but often seen with hepatic steatosis. A subtle wedge-shaped geographic region of decreased echogenicity may represent relative fatty sparing and does not appear particularly masslike. A follow-up ultrasound in 3-6 months could ensure stability or resolution. 2. No acute abnormalities identified.  LMP was last week  BP Readings from Last 3 Encounters:  09/18/17 122/86  05/01/17 119/87  03/19/17 (!) 130/93   Wt Readings from Last 3 Encounters:  09/18/17 202 lb (91.6 kg)  05/01/17 198 lb (89.8 kg)  03/19/17 197 lb 9.6 oz (89.6 kg)     Patient Active Problem List   Diagnosis Date Noted  . Overweight 04/08/2016    Past Medical History:  Diagnosis Date  . Chicken pox   . Depression     Past Surgical History:  Procedure Laterality Date  . TONSILLECTOMY    . WISDOM TOOTH EXTRACTION      Social History   Tobacco Use  . Smoking status: Never Smoker  . Smokeless  tobacco: Never Used  Substance Use Topics  . Alcohol use: Yes    Comment: social   . Drug use: No    Family History  Problem Relation Age of Onset  . Multiple sclerosis Mother   . Depression Mother   . Heart attack Father     No Known Allergies  Medication list has been reviewed and updated.  Current Outpatient Medications on File Prior to Visit  Medication Sig Dispense Refill  . BIOTIN PO Take by mouth.    . DULoxetine (CYMBALTA) 60 MG capsule Take 1 capsule (60 mg total) by mouth daily. 90 capsule 1  . ipratropium (ATROVENT) 0.03 % nasal spray Place 2 sprays into the nose 4 (four) times daily. 30 mL 6  . Milk Thistle 300 MG CAPS Take 1 capsule by mouth  daily.    . vitamin E 1000 UNIT capsule Take 1,000 Units by mouth daily.     No current facility-administered medications on file prior to visit.     Review of Systems:  As per HPI- otherwise negative. No fever or chills No chance of pregnancy per her report   Physical Examination: Vitals:   09/18/17 1719  BP: 122/86  Pulse: 93  Temp: 98 F (36.7 C)  SpO2: 98%   Vitals:   09/18/17 1719  Weight: 202 lb (91.6 kg)  Height: 5\' 1"  (1.549 m)   Body mass index is 38.17 kg/m. Ideal Body Weight: Weight in (lb) to have BMI = 25: 132  GEN: WDWN, NAD, Non-toxic, A & O x 3, obese, looks well  HEENT: Atraumatic, Normocephalic. Neck supple. No masses, No LAD. Ears and Nose: No external deformity. CV: RRR, No M/G/R. No JVD. No thrill. No extra heart sounds. PULM: CTA B, no wheezes, crackles, rhonchi. No retractions. No resp. distress. No accessory muscle use. ABD: S, NT, ND, +BS. No rebound. No HSM.  Belly is benign EXTR: No c/c/e NEURO Normal gait.  PSYCH: Normally interactive. Conversant. Not depressed or anxious appearing.  Calm demeanor.    Assessment and Plan: Incontinence of feces, unspecified fecal incontinence type - Plan: Ambulatory referral to Gastroenterology  Polycythemia - Plan: Comprehensive metabolic panel  Elevated LFTs - Plan: CBC  Here today with concern about gastrointestinal upset and occasional bouts of fecal incontinence which are really embarrassing to her.  She has noted this issue for about one year Will refer her back to GI- she likely needs a colonoscopy.  Labs pending in the meantime.  She has been noted to have slightly elevated LFTs in the past thought due to fatty liver  Signed Lamar Blinks, MD  Received her labs 3/15- message to pt Results for orders placed or performed in visit on 09/18/17  CBC  Result Value Ref Range   WBC 9.8 4.0 - 10.5 K/uL   RBC 5.05 3.87 - 5.11 Mil/uL   Platelets 329.0 150.0 - 400.0 K/uL   Hemoglobin 14.8 12.0 -  15.0 g/dL   HCT 43.6 36.0 - 46.0 %   MCV 86.4 78.0 - 100.0 fl   MCHC 34.0 30.0 - 36.0 g/dL   RDW 13.3 11.5 - 15.5 %  Comprehensive metabolic panel  Result Value Ref Range   Sodium 140 135 - 145 mEq/L   Potassium 4.4 3.5 - 5.1 mEq/L   Chloride 103 96 - 112 mEq/L   CO2 31 19 - 32 mEq/L   Glucose, Bld 98 70 - 99 mg/dL   BUN 11 6 - 23 mg/dL   Creatinine, Ser  0.90 0.40 - 1.20 mg/dL   Total Bilirubin 0.6 0.2 - 1.2 mg/dL   Alkaline Phosphatase 58 39 - 117 U/L   AST 20 0 - 37 U/L   ALT 25 0 - 35 U/L   Total Protein 7.0 6.0 - 8.3 g/dL   Albumin 4.4 3.5 - 5.2 g/dL   Calcium 9.4 8.4 - 10.5 mg/dL   GFR 70.64 >60.00 mL/min

## 2017-09-18 ENCOUNTER — Encounter: Payer: Self-pay | Admitting: Family Medicine

## 2017-09-18 ENCOUNTER — Ambulatory Visit (INDEPENDENT_AMBULATORY_CARE_PROVIDER_SITE_OTHER): Payer: PRIVATE HEALTH INSURANCE | Admitting: Family Medicine

## 2017-09-18 VITALS — BP 122/86 | HR 93 | Temp 98.0°F | Ht 61.0 in | Wt 202.0 lb

## 2017-09-18 DIAGNOSIS — D751 Secondary polycythemia: Secondary | ICD-10-CM

## 2017-09-18 DIAGNOSIS — R945 Abnormal results of liver function studies: Secondary | ICD-10-CM | POA: Diagnosis not present

## 2017-09-18 DIAGNOSIS — R159 Full incontinence of feces: Secondary | ICD-10-CM

## 2017-09-18 DIAGNOSIS — R7989 Other specified abnormal findings of blood chemistry: Secondary | ICD-10-CM

## 2017-09-18 NOTE — Patient Instructions (Signed)
It was nice to see you today- I will be in touch with your labs, and we will set you up to see GI.  They will likely do a colonoscopy for you Certainly try some imodium as needed prior to going out- this may help prevent diarrhea  Let me know if you are getting worse or have any change in your symptoms, and I will be in touch asap

## 2017-09-19 ENCOUNTER — Encounter: Payer: Self-pay | Admitting: Family Medicine

## 2017-09-19 LAB — COMPREHENSIVE METABOLIC PANEL
ALT: 25 U/L (ref 0–35)
AST: 20 U/L (ref 0–37)
Albumin: 4.4 g/dL (ref 3.5–5.2)
Alkaline Phosphatase: 58 U/L (ref 39–117)
BUN: 11 mg/dL (ref 6–23)
CO2: 31 mEq/L (ref 19–32)
Calcium: 9.4 mg/dL (ref 8.4–10.5)
Chloride: 103 mEq/L (ref 96–112)
Creatinine, Ser: 0.9 mg/dL (ref 0.40–1.20)
GFR: 70.64 mL/min (ref >60.00)
Glucose, Bld: 98 mg/dL (ref 70–99)
Potassium: 4.4 mEq/L (ref 3.5–5.1)
Sodium: 140 mEq/L (ref 135–145)
Total Bilirubin: 0.6 mg/dL (ref 0.2–1.2)
Total Protein: 7 g/dL (ref 6.0–8.3)

## 2017-09-19 LAB — CBC
HCT: 43.6 % (ref 36.0–46.0)
Hemoglobin: 14.8 g/dL (ref 12.0–15.0)
MCHC: 34 g/dL (ref 30.0–36.0)
MCV: 86.4 fl (ref 78.0–100.0)
Platelets: 329 10*3/uL (ref 150.0–400.0)
RBC: 5.05 Mil/uL (ref 3.87–5.11)
RDW: 13.3 % (ref 11.5–15.5)
WBC: 9.8 10*3/uL (ref 4.0–10.5)

## 2017-11-19 ENCOUNTER — Encounter: Payer: Self-pay | Admitting: Family Medicine

## 2017-12-19 ENCOUNTER — Encounter: Payer: Self-pay | Admitting: Family Medicine

## 2017-12-19 ENCOUNTER — Telehealth: Payer: Self-pay

## 2017-12-19 NOTE — Telephone Encounter (Signed)
Pt also sent 2 mychart messages, which I will respond to

## 2017-12-19 NOTE — Telephone Encounter (Signed)
Author phoned pt re: warming sensation with accompanying non-itchy red rash to neck and arms this AM that has since resolved. Pt. denies any changes in diet, medication, or routine. Pt. states it came on suddenly, then resolved within ten minutes. Pt denied SOB or additional symptoms with episode. Pt. denies any known history of hot flashes. Author reassured pt. that it did not sound like an acute issue, but to follow up with Dr. Lorelei Pont, as she may want to assess/do labwork and pt. stated she would wait on scheduling an appointment at this time. Pt. to continue to monitor. Routed to Dr. Lorelei Pont for review.

## 2018-03-01 ENCOUNTER — Other Ambulatory Visit: Payer: Self-pay | Admitting: Family Medicine

## 2018-04-10 NOTE — Progress Notes (Addendum)
[facility] [address] [place], [place] [address] [phone]  Date:  04/13/2018   Name:  Jenna Mitchell   DOB:  December 19, 1965   MRN:  [id number]  PCP:  Darreld Mclean, MD    Chief Complaint: Annual Exam (pap smear, bacterial infection)   History of Present Illness:  Jenna Mitchell is a 52 y.o. very pleasant female patient who presents with the following:  Here today for a pap and wellness check Immun:  Needs flu but declins Pap: done at her GYN she thinks a year ago Never had an abnormal except for many years ago - maybe when she was 4- and she had ?a LEEP procedure.  All ok since then however  Labs: full a year ago - will do today  Mammo: she is due, will order for her today  Previously mammo done at [facility] and never had a problem  She declines a tetanus as well   She has noted some concerns about her blood sugar and has checked it at home-  However her glucose readings sound reasonable  She does have pre-diabetes   She has noted right heel pain which sounds like PF- sh has noted pain in her heel upon standing from sitting or when getting up in the am for 3 months or so.  Trying to wear more supportive shoes   Last seen here 6 months ago at which time I referred her to GI: Here today with concern about gastrointestinal upset and occasional bouts of fecal incontinence which are really embarrassing to her.  She has noted this issue for about one year Will refer her back to GI- she likely needs a colonoscopy.  Labs pending in the meantime.  She has been noted to have slightly elevated LFTs in the past thought due to fatty liver  LFTs did look good in March however Patient Active Problem List   Diagnosis Date Noted  . Overweight 04/08/2016    Past Medical History:  Diagnosis Date  . Chicken pox   . Depression     Past Surgical History:  Procedure Laterality Date  . TONSILLECTOMY    . WISDOM TOOTH  EXTRACTION      Social History   Tobacco Use  . Smoking status: Never Smoker  . Smokeless tobacco: Never Used  Substance Use Topics  . Alcohol use: Yes    Comment: social   . Drug use: No    Family History  Problem Relation Age of Onset  . Multiple sclerosis Mother   . Depression Mother   . Heart attack Father     No Known Allergies  Medication list has been reviewed and updated.  Current Outpatient Medications on File Prior to Visit  Medication Sig Dispense Refill  . BIOTIN PO Take by mouth.    . DULoxetine (CYMBALTA) 60 MG capsule TAKE 1 CAPSULE DAILY 90 capsule 1  . Milk Thistle 300 MG CAPS Take 1 capsule by mouth daily.    . Multiple Vitamin (MULTIVITAMIN) capsule Take 1 capsule by mouth daily.    . vitamin E 1000 UNIT capsule Take 1,000 Units by mouth daily.     No current facility-administered medications on file prior to visit.     Review of Systems:  As per HPI- otherwise negative.  No fever or chills   Physical Examination: Vitals:   04/13/18 1043  BP: 122/80  Pulse: 84  Resp: 16  Temp: 98.6 F (  37 C)  SpO2: 97%   Vitals:   04/13/18 1043  Weight: 201 lb (91.2 kg)  Height: 5\' 1"  (1.549 m)   Body mass index is 37.98 kg/m. Ideal Body Weight: Weight in (lb) to have BMI = 25: 132  GEN: WDWN, NAD, Non-toxic, A & O x 3, obese, looks well  HEENT: Atraumatic, Normocephalic. Neck supple. No masses, No LAD. Ears and Nose: No external deformity. CV: RRR, No M/G/R. No JVD. No thrill. No extra heart sounds. PULM: CTA B, no wheezes, crackles, rhonchi. No retractions. No resp. distress. No accessory muscle use. ABD: S, NT, ND EXTR: No c/c/e NEURO Normal gait.  PSYCH: Normally interactive. Conversant. Not depressed or anxious appearing.  Calm demeanor.  Pelvic: normal, no vaginal lesions or discharge. Uterus normal, no CMT, no adnexal tendereness or masses Right foot: she is tender at the plantar fascia insertion at the heel. otherwise the foot is  normal  Assessment and Plan: Screening for cervical cancer - Plan: Cytology - PAP  Pre-diabetes - Plan: Hemoglobin Z6X, Basic metabolic panel  Screening for breast cancer - Plan: MM 3D SCREEN BREAST BILATERAL  Screening for hyperlipidemia - Plan: Lipid panel  Plantar fasciitis  Pap, mammo ordered today Labs pending as above Declined flu shot  Discussed conservative measures for her plantar fasciitis.  At this time she is not interested in having an injection  Will plan further follow- up pending labs.   Signed Lamar Blinks, MD  10/8- received her labs so far, await pap Message to pt   Results for orders placed or performed in visit on 04/13/18  Hemoglobin A1c  Result Value Ref Range   Hgb A1c MFr Bld 5.9 4.6 - 6.5 %  Basic metabolic panel  Result Value Ref Range   Sodium 139 135 - 145 mEq/L   Potassium 4.5 3.5 - 5.1 mEq/L   Chloride 103 96 - 112 mEq/L   CO2 30 19 - 32 mEq/L   Glucose, Bld 99 70 - 99 mg/dL   BUN 17 6 - 23 mg/dL   Creatinine, Ser 0.86 0.40 - 1.20 mg/dL   Calcium 9.0 8.4 - 10.5 mg/dL   GFR 74.28 >60.00 mL/min  Lipid panel  Result Value Ref Range   Cholesterol 179 0 - 200 mg/dL   Triglycerides 148.0 0.0 - 149.0 mg/dL   HDL 49.70 >39.00 mg/dL   VLDL 29.6 0.0 - 40.0 mg/dL   LDL Cholesterol 99 0 - 99 mg/dL   Total CHOL/HDL Ratio 4    NonHDL 128.80

## 2018-04-13 ENCOUNTER — Ambulatory Visit (INDEPENDENT_AMBULATORY_CARE_PROVIDER_SITE_OTHER): Payer: PRIVATE HEALTH INSURANCE | Admitting: Family Medicine

## 2018-04-13 ENCOUNTER — Other Ambulatory Visit (HOSPITAL_COMMUNITY)
Admission: RE | Admit: 2018-04-13 | Discharge: 2018-04-13 | Disposition: A | Discharge status: Home / Self Care | Payer: No Typology Code available for payment source | Source: Ambulatory Visit | Attending: Family Medicine | Admitting: Family Medicine

## 2018-04-13 ENCOUNTER — Encounter: Payer: Self-pay | Admitting: Family Medicine

## 2018-04-13 VITALS — BP 122/80 | HR 84 | Temp 98.6°F | Resp 16 | Ht 61.0 in | Wt 201.0 lb

## 2018-04-13 DIAGNOSIS — R7303 Prediabetes: Secondary | ICD-10-CM | POA: Diagnosis not present

## 2018-04-13 DIAGNOSIS — Z1239 Encounter for other screening for malignant neoplasm of breast: Secondary | ICD-10-CM

## 2018-04-13 DIAGNOSIS — Z13220 Encounter for screening for lipoid disorders: Secondary | ICD-10-CM | POA: Diagnosis not present

## 2018-04-13 DIAGNOSIS — M722 Plantar fascial fibromatosis: Secondary | ICD-10-CM

## 2018-04-13 DIAGNOSIS — Z124 Encounter for screening for malignant neoplasm of cervix: Secondary | ICD-10-CM | POA: Insufficient documentation

## 2018-04-13 LAB — BASIC METABOLIC PANEL
BUN: 17 mg/dL (ref 6–23)
CHLORIDE: 103 meq/L (ref 96–112)
CO2: 30 mEq/L (ref 19–32)
Calcium: 9 mg/dL (ref 8.4–10.5)
Creatinine, Ser: 0.86 mg/dL (ref 0.40–1.20)
GFR: 74.28 mL/min (ref >60.00)
GLUCOSE: 99 mg/dL (ref 70–99)
POTASSIUM: 4.5 meq/L (ref 3.5–5.1)
Sodium: 139 mEq/L (ref 135–145)

## 2018-04-13 LAB — HEMOGLOBIN A1C: Hgb A1c MFr Bld: 5.9 % (ref 4.6–6.5)

## 2018-04-13 LAB — LIPID PANEL
Cholesterol: 179 mg/dL (ref 0–200)
HDL: 49.7 mg/dL (ref >39.00)
LDL Cholesterol: 99 mg/dL (ref 0–99)
NONHDL: 128.8
Total CHOL/HDL Ratio: 4
Triglycerides: 148 mg/dL (ref 0.0–149.0)
VLDL: 29.6 mg/dL (ref 0.0–40.0)

## 2018-04-13 NOTE — Patient Instructions (Signed)
Good to see you today!  I will be in touch with your labs and pap asap Please stop by the ground floor to schedule a mammogram- they may be able to do it while you are here this morning!   It seems that you have plantar fascitis on the left- try stretching, can rolls, ice massage  If not helpful if a few weeks or if getting worse let me know

## 2018-04-14 ENCOUNTER — Encounter: Payer: Self-pay | Admitting: Family Medicine

## 2018-04-15 LAB — CYTOLOGY - PAP
Diagnosis: NEGATIVE
HPV: NOT DETECTED

## 2018-04-16 ENCOUNTER — Encounter: Payer: Self-pay | Admitting: Family Medicine

## 2018-04-18 ENCOUNTER — Ambulatory Visit (HOSPITAL_BASED_OUTPATIENT_CLINIC_OR_DEPARTMENT_OTHER)
Admission: RE | Admit: 2018-04-18 | Discharge: 2018-04-18 | Disposition: A | Discharge status: Home / Self Care | Payer: No Typology Code available for payment source | Source: Ambulatory Visit | Attending: Family Medicine | Admitting: Family Medicine

## 2018-04-18 DIAGNOSIS — Z1239 Encounter for other screening for malignant neoplasm of breast: Secondary | ICD-10-CM | POA: Diagnosis not present

## 2018-07-09 ENCOUNTER — Encounter: Payer: Self-pay | Admitting: Family Medicine

## 2018-08-29 NOTE — Progress Notes (Signed)
[place] [facility] [address] [place], [place] [address] [phone]  Date:  08/31/2018   Name:  Jenna Mitchell   DOB:  [birth year]   MRN:  [id number]  PCP:  Darreld Mclean, MD    Chief Complaint: Skin Concern (frontal vaginal area, itching, redness, afraid it was ring worm? )   History of Present Illness:  Jenna Mitchell is a 53 y.o. very pleasant female patient who presents with the following:  Patient who is generally healthy except for overweight.  Here today with a skin concern She noted a skin finding about one months ago- in the left sided groin area. She thought it might be due to shaving-she tried hydrocortisone which helped but did not get rid of it  She switched to an antifungal about a week ago and it is much better now, seems to be working She is otherwise feeling well  No other rashes or skin lesions  She started a new relationship just recently, but they have not yet been together sexually- he is actually in [place] for a couple of months She does not suspect any STD  Flu: declines  Colon cancer screening: no family history of colon cancer, she would like to do cologuard for primary screening Tetanus vaccine: she declines today  Shingrix: Declines  Patient Active Problem List   Diagnosis Date Noted  . Overweight 04/08/2016    Past Medical History:  Diagnosis Date  . Chicken pox   . Depression     Past Surgical History:  Procedure Laterality Date  . TONSILLECTOMY    . WISDOM TOOTH EXTRACTION      Social History   Tobacco Use  . Smoking status: Never Smoker  . Smokeless tobacco: Never Used  Substance Use Topics  . Alcohol use: Yes    Comment: social   . Drug use: No    Family History  Problem Relation Age of Onset  . Multiple sclerosis Mother   . Depression Mother   . Heart attack Father     No Known Allergies  Medication list has been reviewed and updated.  Current  Outpatient Medications on File Prior to Visit  Medication Sig Dispense Refill  . BIOTIN PO Take by mouth.    . DULoxetine (CYMBALTA) 60 MG capsule TAKE 1 CAPSULE DAILY 90 capsule 1  . Milk Thistle 300 MG CAPS Take 1 capsule by mouth daily.    . Multiple Vitamin (MULTIVITAMIN) capsule Take 1 capsule by mouth daily.    . vitamin E 1000 UNIT capsule Take 1,000 Units by mouth daily.     No current facility-administered medications on file prior to visit.     Review of Systems:  As per HPI- otherwise negative.  BP Readings from Last 3 Encounters:  08/31/18 124/80  04/13/18 122/80  09/18/17 122/86   No fever or chills, no chest pain or shortness of breath   Physical Examination: Vitals:   08/31/18 1056  BP: 124/80  Pulse: (!) 107  Resp: 18  Temp: 98.1 F (36.7 C)  SpO2: 92%   Vitals:   08/31/18 1056  Weight: 198 lb (89.8 kg)  Height: 5\' 1"  (1.549 m)   Body mass index is 37.41 kg/m. Ideal Body Weight: Weight in (lb) to have BMI = 25: 132  GEN: WDWN, NAD, Non-toxic, A & O x 3, obese, looks well HEENT: Atraumatic, Normocephalic. Neck supple. No masses, No LAD. Bilateral TM wnl, oropharynx normal.  PEERL,EOMI.   Ears and Nose: No external deformity. CV: RRR, No M/G/R. No JVD. No thrill. No extra heart sounds. PULM: CTA B, no wheezes, crackles, rhonchi. No retractions. No resp. distress. No accessory muscle use. EXTR: No c/c/e NEURO Normal gait.  PSYCH: Normally interactive. Conversant. Not depressed or anxious appearing.  Calm demeanor.  Suspect slightly low oxygen is due to wearing thick artificial nails She has an approximately 2 inch diameter tinea corporis at the left inguinal fold It does have distinct borders typical of a ringworm lesion  Assessment and Plan: Tinea corporis  Screening for colon cancer  Here today with apparent tinea corporis in the left groin.  She is using over-the-counter antifungal cream, and it is responding.  She is used it for 1 week so  far.  Advised her to use this for 2 to 4 weeks, if this does not resolve her problem please let me know Discussed immunizations, she declines routine shots today I have advised her that she can have her shingles vaccine done anytime now that she is 52 Ordered Cologuard to screen for colon cancer  Signed Lamar Blinks, MD

## 2018-08-30 ENCOUNTER — Other Ambulatory Visit: Payer: Self-pay | Admitting: Family Medicine

## 2018-08-31 ENCOUNTER — Encounter: Payer: Self-pay | Admitting: Family Medicine

## 2018-08-31 ENCOUNTER — Ambulatory Visit: Payer: PRIVATE HEALTH INSURANCE | Admitting: Family Medicine

## 2018-08-31 VITALS — BP 124/80 | HR 90 | Temp 98.1°F | Resp 18 | Ht 61.0 in | Wt 198.0 lb

## 2018-08-31 DIAGNOSIS — B354 Tinea corporis: Secondary | ICD-10-CM

## 2018-08-31 DIAGNOSIS — Z1211 Encounter for screening for malignant neoplasm of colon: Secondary | ICD-10-CM | POA: Diagnosis not present

## 2018-08-31 NOTE — Patient Instructions (Signed)
We will get you set up for cologuard to screen for colon cancer I agree that your skin area looks like "ringworm" Continue your OTC anti-fungal for 2-4 weeks Let me know if this does not clear up!

## 2018-11-09 ENCOUNTER — Encounter: Payer: Self-pay | Admitting: Family Medicine

## 2018-11-10 ENCOUNTER — Other Ambulatory Visit: Payer: Self-pay | Admitting: *Deleted

## 2018-11-10 DIAGNOSIS — Z1211 Encounter for screening for malignant neoplasm of colon: Secondary | ICD-10-CM

## 2019-02-26 ENCOUNTER — Other Ambulatory Visit: Payer: Self-pay | Admitting: Family Medicine

## 2019-05-06 ENCOUNTER — Other Ambulatory Visit (HOSPITAL_BASED_OUTPATIENT_CLINIC_OR_DEPARTMENT_OTHER): Payer: Self-pay | Admitting: Family Medicine

## 2019-05-06 DIAGNOSIS — Z1231 Encounter for screening mammogram for malignant neoplasm of breast: Secondary | ICD-10-CM

## 2019-05-07 ENCOUNTER — Ambulatory Visit (HOSPITAL_BASED_OUTPATIENT_CLINIC_OR_DEPARTMENT_OTHER)
Admission: RE | Admit: 2019-05-07 | Discharge: 2019-05-07 | Disposition: A | Discharge status: Home / Self Care | Payer: PRIVATE HEALTH INSURANCE | Source: Ambulatory Visit | Attending: Family Medicine | Admitting: Family Medicine

## 2019-05-07 ENCOUNTER — Other Ambulatory Visit: Payer: Self-pay

## 2019-05-07 DIAGNOSIS — Z1231 Encounter for screening mammogram for malignant neoplasm of breast: Secondary | ICD-10-CM | POA: Diagnosis not present

## 2019-10-20 ENCOUNTER — Other Ambulatory Visit: Payer: Self-pay

## 2019-10-21 ENCOUNTER — Other Ambulatory Visit: Payer: Self-pay

## 2019-10-21 ENCOUNTER — Ambulatory Visit (INDEPENDENT_AMBULATORY_CARE_PROVIDER_SITE_OTHER): Payer: PRIVATE HEALTH INSURANCE | Admitting: Family Medicine

## 2019-10-21 ENCOUNTER — Encounter: Payer: Self-pay | Admitting: Family Medicine

## 2019-10-21 VITALS — BP 134/86 | HR 97 | Temp 97.4°F | Resp 17 | Ht 61.0 in | Wt 205.0 lb

## 2019-10-21 DIAGNOSIS — D229 Melanocytic nevi, unspecified: Secondary | ICD-10-CM

## 2019-10-21 DIAGNOSIS — Z23 Encounter for immunization: Secondary | ICD-10-CM | POA: Diagnosis not present

## 2019-10-21 DIAGNOSIS — Z1329 Encounter for screening for other suspected endocrine disorder: Secondary | ICD-10-CM

## 2019-10-21 DIAGNOSIS — Z114 Encounter for screening for human immunodeficiency virus [HIV]: Secondary | ICD-10-CM

## 2019-10-21 DIAGNOSIS — Z13220 Encounter for screening for lipoid disorders: Secondary | ICD-10-CM | POA: Diagnosis not present

## 2019-10-21 DIAGNOSIS — D72829 Elevated white blood cell count, unspecified: Secondary | ICD-10-CM

## 2019-10-21 DIAGNOSIS — Z130 Encounter for screening for diseases of the blood and blood-forming organs and certain disorders involving the immune mechanism: Secondary | ICD-10-CM

## 2019-10-21 DIAGNOSIS — Z131 Encounter for screening for diabetes mellitus: Secondary | ICD-10-CM

## 2019-10-21 DIAGNOSIS — Z0000 Encounter for general adult medical examination without abnormal findings: Secondary | ICD-10-CM | POA: Diagnosis not present

## 2019-10-21 NOTE — Patient Instructions (Addendum)
It was great to see you again today, I will be in touch with your labs as soon as possible Please work on exercise Let me know if your periods continue to be very difficult- in this case we may want to get a GYN consultation   2nd shingles vaccine due in 2-6 months  Do your cologuard kit!!     Health Maintenance, Female Adopting a healthy lifestyle and getting preventive care are important in promoting health and wellness. Ask your health care provider about:  The right schedule for you to have regular tests and exams.  Things you can do on your own to prevent diseases and keep yourself healthy. What should I know about diet, weight, and exercise? Eat a healthy diet   Eat a diet that includes plenty of vegetables, fruits, low-fat dairy products, and lean protein.  Do not eat a lot of foods that are high in solid fats, added sugars, or sodium. Maintain a healthy weight Body mass index (BMI) is used to identify weight problems. It estimates body fat based on height and weight. Your health care provider can help determine your BMI and help you achieve or maintain a healthy weight. Get regular exercise Get regular exercise. This is one of the most important things you can do for your health. Most adults should:  Exercise for at least 150 minutes each week. The exercise should increase your heart rate and make you sweat (moderate-intensity exercise).  Do strengthening exercises at least twice a week. This is in addition to the moderate-intensity exercise.  Spend less time sitting. Even light physical activity can be beneficial. Watch cholesterol and blood lipids Have your blood tested for lipids and cholesterol at 52 years of age, then have this test every 5 years. Have your cholesterol levels checked more often if:  Your lipid or cholesterol levels are high.  You are older than 52 years of age.  You are at high risk for heart disease. What should I know about cancer screening?  Depending on your health history and family history, you may need to have cancer screening at various ages. This may include screening for:  Breast cancer.  Cervical cancer.  Colorectal cancer.  Skin cancer.  Lung cancer. What should I know about heart disease, diabetes, and high blood pressure? Blood pressure and heart disease  High blood pressure causes heart disease and increases the risk of stroke. This is more likely to develop in people who have high blood pressure readings, are of African descent, or are overweight.  Have your blood pressure checked: ? Every 3-5 years if you are 62-96 years of age. ? Every year if you are 76 years old or older. Diabetes Have regular diabetes screenings. This checks your fasting blood sugar level. Have the screening done:  Once every three years after age 32 if you are at a normal weight and have a low risk for diabetes.  More often and at a younger age if you are overweight or have a high risk for diabetes. What should I know about preventing infection? Hepatitis B If you have a higher risk for hepatitis B, you should be screened for this virus. Talk with your health care provider to find out if you are at risk for hepatitis B infection. Hepatitis C Testing is recommended for:  Everyone born from [birth year] through 1963.  Anyone with known risk factors for hepatitis C. Sexually transmitted infections (STIs)  Get screened for STIs, including gonorrhea and chlamydia, if: ? You  are sexually active and are younger than 52 years of age. ? You are older than 52 years of age and your health care provider tells you that you are at risk for this type of infection. ? Your sexual activity has changed since you were last screened, and you are at increased risk for chlamydia or gonorrhea. Ask your health care provider if you are at risk.  Ask your health care provider about whether you are at high risk for HIV. Your health care provider may recommend a  prescription medicine to help prevent HIV infection. If you choose to take medicine to prevent HIV, you should first get tested for HIV. You should then be tested every 3 months for as long as you are taking the medicine. Pregnancy  If you are about to stop having your period (premenopausal) and you may become pregnant, seek counseling before you get pregnant.  Take 400 to 800 micrograms (mcg) of folic acid every day if you become pregnant.  Ask for birth control (contraception) if you want to prevent pregnancy. Osteoporosis and menopause Osteoporosis is a disease in which the bones lose minerals and strength with aging. This can result in bone fractures. If you are 57 years old or older, or if you are at risk for osteoporosis and fractures, ask your health care provider if you should:  Be screened for bone loss.  Take a calcium or vitamin D supplement to lower your risk of fractures.  Be given hormone replacement therapy (HRT) to treat symptoms of menopause. Follow these instructions at home: Lifestyle  Do not use any products that contain nicotine or tobacco, such as cigarettes, e-cigarettes, and chewing tobacco. If you need help quitting, ask your health care provider.  Do not use street drugs.  Do not share needles.  Ask your health care provider for help if you need support or information about quitting drugs. Alcohol use  Do not drink alcohol if: ? Your health care provider tells you not to drink. ? You are pregnant, may be pregnant, or are planning to become pregnant.  If you drink alcohol: ? Limit how much you use to 0-1 drink a day. ? Limit intake if you are breastfeeding.  Be aware of how much alcohol is in your drink. In the [place] one drink equals one 12 oz bottle of beer (355 mL), one 5 oz glass of wine (148 mL), or one 1 oz glass of hard liquor (44 mL). General instructions  Schedule regular health, dental, and eye exams.  Stay current with your vaccines.   Tell your health care provider if: ? You often feel depressed. ? You have ever been abused or do not feel safe at home. Summary  Adopting a healthy lifestyle and getting preventive care are important in promoting health and wellness.  Follow your health care provider's instructions about healthy diet, exercising, and getting tested or screened for diseases.  Follow your health care provider's instructions on monitoring your cholesterol and blood pressure. This information is not intended to replace advice given to you by your health care provider. Make sure you discuss any questions you have with your health care provider. Document Revised: 06/17/2018 Document Reviewed: 06/17/2018 Elsevier Patient Education  2020 [employer].

## 2019-10-21 NOTE — Progress Notes (Addendum)
[facility] [name], [name], [name] [address] [phone]  Date:  10/21/2019   Name:  Jenna Mitchell   DOB:  [birth year]   MRN:  [id number]  PCP:  Jenna Mclean, MD    Chief Complaint: Annual Exam (annual exam, lab work)   History of Present Illness:  Jenna Mitchell is a 54 y.o. very pleasant female patient who presents with the following:  Here today for routine physical Patient with history of overweight, depression Last seen by myself February 2020  Colon cancer screening; she has her cologuard at home Pap is up-to-date Mammogram up-to-date Tetanus vaccine due- she declines today  COVID-19 vaccine- she is nervous about doing this as her mom had a bad reaction  Shingrix- will give today  Due for complete labs  Her menses started yesterday- she thought she was going through menopause as she recently went 2 months without bleeding Her menses are getting to be more painful and heavy recently   She plans to work on getting more exercise and will try to lose weight She does not smoke   Patient Active Problem List   Diagnosis Date Noted  . Overweight 04/08/2016    Past Medical History:  Diagnosis Date  . Chicken pox   . Depression     Past Surgical History:  Procedure Laterality Date  . TONSILLECTOMY    . WISDOM TOOTH EXTRACTION      Social History   Tobacco Use  . Smoking status: Never Smoker  . Smokeless tobacco: Never Used  Substance Use Topics  . Alcohol use: Yes    Comment: social   . Drug use: No    Family History  Problem Relation Age of Onset  . Multiple sclerosis Mother   . Depression Mother   . Heart attack Father     No Known Allergies  Medication list has been reviewed and updated.  Current Outpatient Medications on File Prior to Visit  Medication Sig Dispense Refill  . BIOTIN PO Take by mouth.    . DULoxetine (CYMBALTA) 60 MG capsule TAKE 1 CAPSULE DAILY 90  capsule 3  . Multiple Vitamin (MULTIVITAMIN) capsule Take 1 capsule by mouth daily.    . Multiple Vitamins-Minerals (OCUVITE PO) Take 500 mg by mouth daily.    . vitamin E 1000 UNIT capsule Take 1,000 Units by mouth daily.     No current facility-administered medications on file prior to visit.    Review of Systems:  As per HPI- otherwise negative.   Physical Examination: Vitals:   10/21/19 1435  BP: 134/86  Pulse: 97  Resp: 17  Temp: (!) 97.4 F (36.3 C)  SpO2: 95%   Vitals:   10/21/19 1435  Weight: 205 lb (93 kg)  Height: _0  (1.549 m)   Body mass index is 38.73 kg/m. Ideal Body Weight: Weight in (lb) to have BMI = 25: 132  GEN: no acute distress.  Obese, looks well  HEENT: Atraumatic, Normocephalic.   Bilateral TM wnl, oropharynx normal.  PEERL,EOMI.   Ears and Nose: No external deformity. CV: RRR, No M/G/R. No JVD. No thrill. No extra heart sounds. PULM: CTA B, no wheezes, crackles, rhonchi. No retractions. No resp. distress. No accessory muscle use. ABD: S, NT, ND, +BS. No rebound. No HSM. EXTR: No c/c/e PSYCH: Normally interactive. Conversant.    Assessment and Plan: Physical exam  Screening for hyperlipidemia - Plan: Lipid panel  Screening for  thyroid disorder - Plan: TSH  Screening for diabetes mellitus - Plan: Comprehensive metabolic panel, Hemoglobin A1c  Screening for deficiency anemia - Plan: CBC  Screening for HIV (human immunodeficiency virus)  Immunization due - Plan: Varicella-zoster vaccine IM (Shingrix)  Change in mole - Plan: Ambulatory referral to Dermatology  CPE today Labs pending as above shingix #1 given today Will plan further follow- up pending labs. Reminded that she needs a tetanus booster if any dirty wound Encourage her to complete cologuard kit asap   This visit occurred during the SARS-CoV-2 public health emergency.  Safety protocols were in place, including screening questions prior to the visit, additional usage of  staff PPE, and extensive cleaning of exam room while observing appropriate contact time as indicated for disinfecting solutions.   Encourage exercise, healthy diet, tobacco and alcohol avoidance Signed Jenna Blinks, MD  Addendum 4/16, received her labs as below.  Message to patient Leukocytosis is a new finding A1c stable in prediabetes range Results for orders placed or performed in visit on 10/21/19  CBC  Result Value Ref Range   WBC 15.5 (H) 4.0 - 10.5 K/uL   RBC 4.91 3.87 - 5.11 Mil/uL   Platelets 341.0 150.0 - 400.0 K/uL   Hemoglobin 14.4 12.0 - 15.0 g/dL   HCT 42.7 36.0 - 46.0 %   MCV 86.8 78.0 - 100.0 fl   MCHC 33.7 30.0 - 36.0 g/dL   RDW 13.2 11.5 - 15.5 %  Comprehensive metabolic panel  Result Value Ref Range   Sodium 141 135 - 145 mEq/L   Potassium 4.3 3.5 - 5.1 mEq/L   Chloride 104 96 - 112 mEq/L   CO2 28 19 - 32 mEq/L   Glucose, Bld 136 (H) 70 - 99 mg/dL   BUN 10 6 - 23 mg/dL   Creatinine, Ser 0.97 0.40 - 1.20 mg/dL   Total Bilirubin 1.3 (H) 0.2 - 1.2 mg/dL   Alkaline Phosphatase 58 39 - 117 U/L   AST 19 0 - 37 U/L   ALT 20 0 - 35 U/L   Total Protein 6.6 6.0 - 8.3 g/dL   Albumin 4.3 3.5 - 5.2 g/dL   GFR 60.45 >60.00 mL/min   Calcium 9.1 8.4 - 10.5 mg/dL  Hemoglobin A1c  Result Value Ref Range   Hgb A1c MFr Bld 5.9 4.6 - 6.5 %  Lipid panel  Result Value Ref Range   Cholesterol 204 (H) 0 - 200 mg/dL   Triglycerides 151.0 (H) 0.0 - 149.0 mg/dL   HDL 50.50 >39.00 mg/dL   VLDL 30.2 0.0 - 40.0 mg/dL   LDL Cholesterol 123 (H) 0 - 99 mg/dL   Total CHOL/HDL Ratio 4    NonHDL 153.43   TSH  Result Value Ref Range   TSH 1.73 0.35 - 4.50 uIU/mL

## 2019-10-22 ENCOUNTER — Encounter: Payer: Self-pay | Admitting: Family Medicine

## 2019-10-22 LAB — COMPREHENSIVE METABOLIC PANEL
ALT: 20 U/L (ref 0–35)
AST: 19 U/L (ref 0–37)
Albumin: 4.3 g/dL (ref 3.5–5.2)
Alkaline Phosphatase: 58 U/L (ref 39–117)
BUN: 10 mg/dL (ref 6–23)
CO2: 28 mEq/L (ref 19–32)
Calcium: 9.1 mg/dL (ref 8.4–10.5)
Chloride: 104 mEq/L (ref 96–112)
Creatinine, Ser: 0.97 mg/dL (ref 0.40–1.20)
GFR: 60.45 mL/min (ref >60.00)
Glucose, Bld: 136 mg/dL — ABNORMAL HIGH (ref 70–99)
Potassium: 4.3 mEq/L (ref 3.5–5.1)
Sodium: 141 mEq/L (ref 135–145)
Total Bilirubin: 1.3 mg/dL — ABNORMAL HIGH (ref 0.2–1.2)
Total Protein: 6.6 g/dL (ref 6.0–8.3)

## 2019-10-22 LAB — HEMOGLOBIN A1C: Hgb A1c MFr Bld: 5.9 % (ref 4.6–6.5)

## 2019-10-22 LAB — LIPID PANEL
Cholesterol: 204 mg/dL — ABNORMAL HIGH (ref 0–200)
HDL: 50.5 mg/dL (ref >39.00)
LDL Cholesterol: 123 mg/dL — ABNORMAL HIGH (ref 0–99)
NonHDL: 153.43
Total CHOL/HDL Ratio: 4
Triglycerides: 151 mg/dL — ABNORMAL HIGH (ref 0.0–149.0)
VLDL: 30.2 mg/dL (ref 0.0–40.0)

## 2019-10-22 LAB — CBC
HCT: 42.7 % (ref 36.0–46.0)
Hemoglobin: 14.4 g/dL (ref 12.0–15.0)
MCHC: 33.7 g/dL (ref 30.0–36.0)
MCV: 86.8 fl (ref 78.0–100.0)
Platelets: 341 10*3/uL (ref 150.0–400.0)
RBC: 4.91 Mil/uL (ref 3.87–5.11)
RDW: 13.2 % (ref 11.5–15.5)
WBC: 15.5 10*3/uL — ABNORMAL HIGH (ref 4.0–10.5)

## 2019-10-22 LAB — TSH: TSH: 1.73 u[IU]/mL (ref 0.35–4.50)

## 2019-10-22 NOTE — Addendum Note (Signed)
Addended by: Lamar Blinks C on: 10/22/2019 04:41 PM   Modules accepted: Orders

## 2020-02-21 ENCOUNTER — Other Ambulatory Visit: Payer: Self-pay | Admitting: Family Medicine

## 2020-06-01 NOTE — Progress Notes (Addendum)
[place] at [facility], [place], [place] [address] [phone]  Date:  06/05/2020   Name:  Jenna Mitchell   DOB:  08-29-1965   MRN:  [id number]  PCP:  Darreld Mclean, MD    Chief Complaint: Medication Management (cymbalta )   History of Present Illness:  Jenna Mitchell is a 54 y.o. very pleasant female patient who presents with the following:  Here today for medication check and follow-up- history of overweight and depression Last seen by myself for a physical in April of this year  She has thoracic outlet syndrome on the left side   Hep C screening-we will take care today Flu shot- pt declines today Colon-patient has Cologuard kit at home, I encouraged her to complete is possible covid vaccine-not done yet, discussed with patient and encouraged her to get this done  shingrix #2- will give today  Most recent labs 4/21; would like to recheck A1c and her white blood cell count today  Taking cymbalta for depression and anxiety  She is not sure if she still needs her cymbalta and it got expensive-the price went up under her insurance She feels like her stress is under pretty good control right now She would like to try stopping  Cymbalta.  We discussed how to taper it.  It was noted right shoulder pain for about 1 month now.  She is not aware of any particular injury, but she does have a large dog patient has pulled her arm when she is walking him.  She notes the pain in the anterior shoulder especially with internal rotation Patient Active Problem List   Diagnosis Date Noted  . Overweight 04/08/2016    Past Medical History:  Diagnosis Date  . Chicken pox   . Depression     Past Surgical History:  Procedure Laterality Date  . TONSILLECTOMY    . WISDOM TOOTH EXTRACTION      Social History   Tobacco Use  . Smoking status: Never Smoker  . Smokeless tobacco: Never Used  Substance Use Topics  .  Alcohol use: Yes    Comment: social   . Drug use: No    Family History  Problem Relation Age of Onset  . Multiple sclerosis Mother   . Depression Mother   . Heart attack Father     No Known Allergies  Medication list has been reviewed and updated.  Current Outpatient Medications on File Prior to Visit  Medication Sig Dispense Refill  . BIOTIN PO Take by mouth.    . DULoxetine (CYMBALTA) 60 MG capsule TAKE 1 CAPSULE DAILY 90 capsule 3  . Multiple Vitamin (MULTIVITAMIN) capsule Take 1 capsule by mouth daily.    . Multiple Vitamins-Minerals (OCUVITE PO) Take 500 mg by mouth daily.    . vitamin E 1000 UNIT capsule Take 1,000 Units by mouth daily.     No current facility-administered medications on file prior to visit.    Review of Systems:  As per HPI- otherwise negative.   Physical Examination: Vitals:   06/05/20 1435  BP: 132/88  Pulse: 87  Resp: 16  SpO2: 96%   Vitals:   06/05/20 1435  Weight: 201 lb (91.2 kg)  Height: '5\' 1"'  (1.549 m)   Body mass index is 37.98 kg/m. Ideal Body Weight: Weight in (lb) to have BMI = 25: 132  GEN: no acute distress.  Obese, otherwise looks well HEENT: Atraumatic, Normocephalic.  Ears  and Nose: No external deformity. CV: RRR, No M/G/R. No JVD. No thrill. No extra heart sounds. PULM: CTA B, no wheezes, crackles, rhonchi. No retractions. No resp. distress. No accessory muscle use. EXTR: No c/c/e PSYCH: Normally interactive. Conversant.  Right shoulder with normal range of motion, full flexion and abduction.  Normal external rotation, internal rotation and Hawkins testing uncomfortable.  Normal strength and sensation, negative empty can test    Assessment and Plan: Acute pain of right shoulder  Immunization due - Plan: Varicella-zoster vaccine IM (Shingrix)  Leukocytosis, unspecified type - Plan: CBC  Pre-diabetes - Plan: Hemoglobin A1c  Encounter for hepatitis C screening test for low risk patient - Plan: Hepatitis C  antibody  Anxiety and depression  Following up today-received second dose of Shingrix Encouraged flu shot and COVID-19 series Reminded her to complete her Cologuard kit Check A1c today to follow-up in prediabetes Follow-up of leukocytosis today Routine hepatitis C screening Discussed right shoulder pain.  Suspect this is due to rotator cuff tendinitis.  Gave patient a handout with exercises to try at home.  She is not eager to have a steroid injection, so less reason to refer to orthopedics at this time.  She will let me know if this is not improving Suggested Voltaren gel She would like to try coming off Cymbalta.  Suggested that she take every other day for 2 weeks, then every third day for about 2 weeks then stop Will plan further follow- up pending labs.  This visit occurred during the SARS-CoV-2 public health emergency.  Safety protocols were in place, including screening questions prior to the visit, additional usage of staff PPE, and extensive cleaning of exam room while observing appropriate contact time as indicated for disinfecting solutions.    Signed Lamar Blinks, MD  addnd 11/3- received labs as below, message to pt  Results for orders placed or performed in visit on 06/05/20  CBC  Result Value Ref Range   WBC 9.9 4.0 - 10.5 K/uL   RBC 5.03 3.87 - 5.11 Mil/uL   Platelets 325.0 150 - 400 K/uL   Hemoglobin 14.5 12.0 - 15.0 g/dL   HCT 43.4 36 - 46 %   MCV 86.4 78.0 - 100.0 fl   MCHC 33.5 30.0 - 36.0 g/dL   RDW 13.0 11.5 - 15.5 %  Hemoglobin A1c  Result Value Ref Range   Hgb A1c MFr Bld 5.9 4.6 - 6.5 %  Hepatitis C antibody  Result Value Ref Range   Hepatitis C Ab NON-REACTIVE NON-REACTI   SIGNAL TO CUT-OFF 0.01 <1.00

## 2020-06-05 ENCOUNTER — Other Ambulatory Visit: Payer: Self-pay

## 2020-06-05 ENCOUNTER — Encounter: Payer: Self-pay | Admitting: Family Medicine

## 2020-06-05 ENCOUNTER — Ambulatory Visit: Payer: PRIVATE HEALTH INSURANCE | Admitting: Family Medicine

## 2020-06-05 VITALS — BP 132/88 | HR 87 | Resp 16 | Ht 61.0 in | Wt 201.0 lb

## 2020-06-05 DIAGNOSIS — M25511 Pain in right shoulder: Secondary | ICD-10-CM

## 2020-06-05 DIAGNOSIS — R7303 Prediabetes: Secondary | ICD-10-CM

## 2020-06-05 DIAGNOSIS — Z23 Encounter for immunization: Secondary | ICD-10-CM

## 2020-06-05 DIAGNOSIS — D72829 Elevated white blood cell count, unspecified: Secondary | ICD-10-CM

## 2020-06-05 DIAGNOSIS — F419 Anxiety disorder, unspecified: Secondary | ICD-10-CM

## 2020-06-05 DIAGNOSIS — Z1159 Encounter for screening for other viral diseases: Secondary | ICD-10-CM

## 2020-06-05 DIAGNOSIS — F32A Depression, unspecified: Secondary | ICD-10-CM

## 2020-06-05 NOTE — Patient Instructions (Addendum)
It was good to see you again today- we will be in touch with you labs asap You got your 2nd dose of shingles vaccine today I do encourage you to get the covid 19 vaccine- these are our best tool to prevent death from covid respiratory failure and death.  I have had 3 doses myself!  You might try some Voltaren gel for your shoulder pain.  I also gave you some exercises to try for presumed rotator cuff tedoniits  We will check your A1c, white cell count and hep C screening today

## 2020-06-06 ENCOUNTER — Encounter: Payer: Self-pay | Admitting: Family Medicine

## 2020-06-06 LAB — CBC
HCT: 43.4 % (ref 36.0–46.0)
Hemoglobin: 14.5 g/dL (ref 12.0–15.0)
MCHC: 33.5 g/dL (ref 30.0–36.0)
MCV: 86.4 fl (ref 78.0–100.0)
Platelets: 325 10*3/uL (ref 150.0–400.0)
RBC: 5.03 Mil/uL (ref 3.87–5.11)
RDW: 13 % (ref 11.5–15.5)
WBC: 9.9 10*3/uL (ref 4.0–10.5)

## 2020-06-06 LAB — HEPATITIS C ANTIBODY
Hepatitis C Ab: NONREACTIVE
SIGNAL TO CUT-OFF: 0.01 (ref <1.00)

## 2020-06-06 LAB — HEMOGLOBIN A1C: Hgb A1c MFr Bld: 5.9 % (ref 4.6–6.5)

## 2020-07-05 ENCOUNTER — Encounter (HOSPITAL_BASED_OUTPATIENT_CLINIC_OR_DEPARTMENT_OTHER): Payer: PRIVATE HEALTH INSURANCE

## 2020-07-05 DIAGNOSIS — Z1231 Encounter for screening mammogram for malignant neoplasm of breast: Secondary | ICD-10-CM

## 2020-07-14 ENCOUNTER — Encounter: Payer: Self-pay | Admitting: Family Medicine

## 2020-07-15 MED ORDER — TRIAMCINOLONE ACETONIDE 0.1 % EX CREA: 1.0000 "application " | TOPICAL_CREAM | Freq: Two times a day (BID) | CUTANEOUS | 0 refills | Status: DC

## 2020-07-20 MED ORDER — PREDNISONE 20 MG PO TABS: ORAL_TABLET | ORAL | 0 refills | Status: DC

## 2020-07-20 NOTE — Addendum Note (Signed)
Addended by: Lamar Blinks C on: 07/20/2020 04:37 PM   Modules accepted: Orders

## 2020-10-03 ENCOUNTER — Encounter: Payer: Self-pay | Admitting: Family Medicine

## 2020-10-04 ENCOUNTER — Other Ambulatory Visit: Payer: Self-pay | Admitting: *Deleted

## 2020-10-04 ENCOUNTER — Telehealth: Payer: Self-pay | Admitting: *Deleted

## 2020-10-04 DIAGNOSIS — Z1211 Encounter for screening for malignant neoplasm of colon: Secondary | ICD-10-CM

## 2020-10-04 NOTE — Telephone Encounter (Signed)
cologuard order placed

## 2021-01-09 LAB — COLOGUARD: Cologuard: NEGATIVE

## 2021-01-12 LAB — COLOGUARD: COLOGUARD: NEGATIVE

## 2021-01-15 ENCOUNTER — Encounter: Payer: Self-pay | Admitting: Family Medicine

## 2021-01-24 ENCOUNTER — Encounter (INDEPENDENT_AMBULATORY_CARE_PROVIDER_SITE_OTHER): Payer: Self-pay

## 2021-01-26 ENCOUNTER — Encounter: Payer: Self-pay | Admitting: Family Medicine

## 2021-02-06 NOTE — Patient Instructions (Addendum)
Good to see you again today, I will be in touch with your labs as it is possible We will set you up with neurology for sleep evaluation/ sleep apnea testing I ordered a mammogram for you! Please get a tetanus if you have any dirty wound.  I also do recommend the covid series!    Take care and it was good to see you again today

## 2021-02-06 NOTE — Progress Notes (Addendum)
[place] at [facility] [address] [place], [place] [address] [phone]  Date:  02/12/2021   Name:  Alecea Sagar   DOB:  1966-07-06   MRN:  [id number]  PCP:  Darreld Mclean, MD    Chief Complaint: cpe (CPE - tired all the time )   History of Present Illness:  Jenna Mitchell is a 55 y.o. very pleasant female patient who presents with the following:  Patient seen today for physical exam Most recent visit with myself was in November She was seen in the ER in [place]; possible suicidal ideation.  I mentioned this today and patient said she did not wish to talk about it.  She states that she is safe and at no risk for self-harm  Pt. is a 55 year old Caucasian female who was brought to the ED by police due to suicidal ideation. Pt. stated that she had been in a police interview when she had gotten angry and made a statement that she was going to cut her wrists. Pt. denied any current suicidal ideation stating that she was angry at the time and did not intend to harm herself. Pt. denied previous attempts to harm herself and denied current ideation, plan or intent. Pt. denied endorsed symptoms of depression that have been going on for years including: hopelessness, crying spells and social withdrawal for which she currently takes Cymbalta. Pt. endorsed having "a couple of drinks" on weekends though denied any regular alcohol use. Pt. denied any other SA and UDS was pending at time of assessment. Pt. denied HI and AVH. Pt. is recommended for discharge with outpatient resources based on her denial of SI, HI and AVH. Recommendation was discussed with Chanetta Marshall, MD who was in agreement  COVID-19 series- pt has declined  Tetanus- not since childhood.  Encouraged but she declines  Mammogram October 2020 Most recent labs April 2021 Cologuard last month Pap is coming due this year Her last menses were in June and she bled for longer than  normal  Her menses are spacing out the last year or so and she is getting hot flashes   She has mentioned wanting to stop cymbalta but she has suffered withdrawal when she tried to stop  She noted snoring as per her sleep app and has been concerned about sleep apnea She lost about 15 lbs intentionally and snoring has improved However, she wonders if she does have sleep - apnea and would be interested in being tested formally She notes that she often feels tired and worn out, and she may have a deviated septum    Patient Active Problem List   Diagnosis Date Noted   Overweight 04/08/2016    Past Medical History:  Diagnosis Date   Chicken pox    Depression     Past Surgical History:  Procedure Laterality Date   TONSILLECTOMY     WISDOM TOOTH EXTRACTION      Social History   Tobacco Use   Smoking status: Never   Smokeless tobacco: Never  Substance Use Topics   Alcohol use: Yes    Comment: social    Drug use: No    Family History  Problem Relation Age of Onset   Multiple sclerosis Mother    Depression Mother    Heart attack Father     No Known Allergies  Medication list has been reviewed and updated.  Current Outpatient Medications on File Prior to Visit  Medication Sig Dispense Refill   BIOTIN PO Take by mouth.     Multiple Vitamin (MULTIVITAMIN) capsule Take 1 capsule by mouth daily.     No current facility-administered medications on file prior to visit.    Review of Systems:  As per HPI- otherwise negative.   Physical Examination: Vitals:   02/12/21 1028  BP: 128/80  Pulse: (!) 102  Temp: 98 F (36.7 C)  SpO2: 96%   Vitals:   02/12/21 1028  Weight: 191 lb 6.4 oz (86.8 kg)  Height: '5\' 2"'$  (1.575 m)   Body mass index is 35.01 kg/m. Ideal Body Weight: Weight in (lb) to have BMI = 25: 136.4  GEN: no acute distress.  Obese, otherwise looks well HEENT: Atraumatic, Normocephalic.  Bilateral TM wnl, oropharynx normal.  PEERL,EOMI.   Ears and  Nose: No external deformity. CV: RRR, No M/G/R. No JVD. No thrill. No extra heart sounds. PULM: CTA B, no wheezes, crackles, rhonchi. No retractions. No resp. distress. No accessory muscle use. ABD: S, NT, ND, +BS. No rebound. No HSM. EXTR: No c/c/e PSYCH: Normally interactive. Conversant.  Breast: normal exam, no masses/ dimpling/ discharge Uterus normal, no CMT, no adnexal tendereness or masses   Assessment and Plan: Physical exam  Screening for hyperlipidemia - Plan: Lipid panel  Screening for diabetes mellitus - Plan: Comprehensive metabolic panel, Hemoglobin A1c  Screening for thyroid disorder - Plan: TSH  Pre-diabetes - Plan: CBC, Hemoglobin A1c  Fatigue, unspecified type - Plan: TSH, VITAMIN D 25 Hydroxy (Vit-D Deficiency, Fractures)  Screening mammogram for breast cancer - Plan: MM 3D SCREEN BREAST BILATERAL  Anxiety and depression - Plan: DULoxetine (CYMBALTA) 60 MG capsule  Snoring - Plan: Ambulatory referral to Neurology  Screening for cervical cancer - Plan: Cytology - PAP  Physical exam today.  Encouraged healthy diet and exercise routine She would like to continue Cymbalta since she found a more affordable pharmacy.  I refilled Concerned about possible sleep apnea, referral placed to neurology  Will plan further follow- up pending labs.  Signed Lamar Blinks, MD  Received labs as below, message to pt  Results for orders placed or performed in visit on 02/12/21  CBC  Result Value Ref Range   WBC 5.2 4.0 - 10.5 K/uL   RBC 5.14 (H) 3.87 - 5.11 Mil/uL   Platelets 206.0 150.0 - 400.0 K/uL   Hemoglobin 14.7 12.0 - 15.0 g/dL   HCT 43.6 36.0 - 46.0 %   MCV 84.9 78.0 - 100.0 fl   MCHC 33.8 30.0 - 36.0 g/dL   RDW 12.7 11.5 - 15.5 %  Comprehensive metabolic panel  Result Value Ref Range   Sodium 141 135 - 145 mEq/L   Potassium 4.5 3.5 - 5.1 mEq/L   Chloride 104 96 - 112 mEq/L   CO2 29 19 - 32 mEq/L   Glucose, Bld 109 (H) 70 - 99 mg/dL   BUN 11 6 - 23  mg/dL   Creatinine, Ser 0.95 0.40 - 1.20 mg/dL   Total Bilirubin 0.9 0.2 - 1.2 mg/dL   Alkaline Phosphatase 65 39 - 117 U/L   AST 30 0 - 37 U/L   ALT 37 (H) 0 - 35 U/L   Total Protein 6.8 6.0 - 8.3 g/dL   Albumin 4.4 3.5 - 5.2 g/dL   GFR 68.79 >60.00 mL/min   Calcium 9.4 8.4 - 10.5 mg/dL  Hemoglobin A1c  Result Value Ref Range   Hgb A1c MFr Bld 6.2 4.6 - 6.5 %  Lipid panel  Result Value Ref Range   Cholesterol 183 0 - 200 mg/dL   Triglycerides 146.0 0.0 - 149.0 mg/dL   HDL 47.40 >39.00 mg/dL   VLDL 29.2 0.0 - 40.0 mg/dL   LDL Cholesterol 106 (H) 0 - 99 mg/dL   Total CHOL/HDL Ratio 4    NonHDL 135.67   TSH  Result Value Ref Range   TSH 1.65 0.35 - 5.50 uIU/mL  VITAMIN D 25 Hydroxy (Vit-D Deficiency, Fractures)  Result Value Ref Range   VITD 35.17 30.00 - 100.00 ng/mL

## 2021-02-12 ENCOUNTER — Encounter: Payer: Self-pay | Admitting: Family Medicine

## 2021-02-12 ENCOUNTER — Other Ambulatory Visit (HOSPITAL_COMMUNITY)
Admission: RE | Admit: 2021-02-12 | Discharge: 2021-02-12 | Disposition: A | Discharge status: Home / Self Care | Payer: Managed Care, Other (non HMO) | Source: Ambulatory Visit | Attending: Family Medicine | Admitting: Family Medicine

## 2021-02-12 ENCOUNTER — Other Ambulatory Visit: Payer: Self-pay

## 2021-02-12 ENCOUNTER — Ambulatory Visit (INDEPENDENT_AMBULATORY_CARE_PROVIDER_SITE_OTHER): Payer: Managed Care, Other (non HMO) | Admitting: Family Medicine

## 2021-02-12 VITALS — BP 128/80 | HR 102 | Temp 98.0°F | Ht 62.0 in | Wt 191.4 lb

## 2021-02-12 DIAGNOSIS — Z124 Encounter for screening for malignant neoplasm of cervix: Secondary | ICD-10-CM | POA: Insufficient documentation

## 2021-02-12 DIAGNOSIS — R7303 Prediabetes: Secondary | ICD-10-CM

## 2021-02-12 DIAGNOSIS — Z1329 Encounter for screening for other suspected endocrine disorder: Secondary | ICD-10-CM | POA: Diagnosis not present

## 2021-02-12 DIAGNOSIS — F419 Anxiety disorder, unspecified: Secondary | ICD-10-CM

## 2021-02-12 DIAGNOSIS — Z0000 Encounter for general adult medical examination without abnormal findings: Secondary | ICD-10-CM | POA: Diagnosis not present

## 2021-02-12 DIAGNOSIS — F32A Depression, unspecified: Secondary | ICD-10-CM

## 2021-02-12 DIAGNOSIS — Z131 Encounter for screening for diabetes mellitus: Secondary | ICD-10-CM | POA: Diagnosis not present

## 2021-02-12 DIAGNOSIS — R0683 Snoring: Secondary | ICD-10-CM

## 2021-02-12 DIAGNOSIS — R5383 Other fatigue: Secondary | ICD-10-CM | POA: Diagnosis not present

## 2021-02-12 DIAGNOSIS — Z13220 Encounter for screening for lipoid disorders: Secondary | ICD-10-CM | POA: Diagnosis not present

## 2021-02-12 DIAGNOSIS — Z1231 Encounter for screening mammogram for malignant neoplasm of breast: Secondary | ICD-10-CM

## 2021-02-12 LAB — HEMOGLOBIN A1C: Hgb A1c MFr Bld: 6.2 % (ref 4.6–6.5)

## 2021-02-12 LAB — COMPREHENSIVE METABOLIC PANEL
ALT: 37 U/L — ABNORMAL HIGH (ref 0–35)
AST: 30 U/L (ref 0–37)
Albumin: 4.4 g/dL (ref 3.5–5.2)
Alkaline Phosphatase: 65 U/L (ref 39–117)
BUN: 11 mg/dL (ref 6–23)
CO2: 29 mEq/L (ref 19–32)
Calcium: 9.4 mg/dL (ref 8.4–10.5)
Chloride: 104 mEq/L (ref 96–112)
Creatinine, Ser: 0.95 mg/dL (ref 0.40–1.20)
GFR: 68.79 mL/min (ref >60.00)
Glucose, Bld: 109 mg/dL — ABNORMAL HIGH (ref 70–99)
Potassium: 4.5 mEq/L (ref 3.5–5.1)
Sodium: 141 mEq/L (ref 135–145)
Total Bilirubin: 0.9 mg/dL (ref 0.2–1.2)
Total Protein: 6.8 g/dL (ref 6.0–8.3)

## 2021-02-12 LAB — CBC
HCT: 43.6 % (ref 36.0–46.0)
Hemoglobin: 14.7 g/dL (ref 12.0–15.0)
MCHC: 33.8 g/dL (ref 30.0–36.0)
MCV: 84.9 fl (ref 78.0–100.0)
Platelets: 206 10*3/uL (ref 150.0–400.0)
RBC: 5.14 Mil/uL — ABNORMAL HIGH (ref 3.87–5.11)
RDW: 12.7 % (ref 11.5–15.5)
WBC: 5.2 10*3/uL (ref 4.0–10.5)

## 2021-02-12 LAB — LIPID PANEL
Cholesterol: 183 mg/dL (ref 0–200)
HDL: 47.4 mg/dL (ref >39.00)
LDL Cholesterol: 106 mg/dL — ABNORMAL HIGH (ref 0–99)
NonHDL: 135.67
Total CHOL/HDL Ratio: 4
Triglycerides: 146 mg/dL (ref 0.0–149.0)
VLDL: 29.2 mg/dL (ref 0.0–40.0)

## 2021-02-12 LAB — VITAMIN D 25 HYDROXY (VIT D DEFICIENCY, FRACTURES): VITD: 35.17 ng/mL (ref 30.00–100.00)

## 2021-02-12 LAB — TSH: TSH: 1.65 u[IU]/mL (ref 0.35–5.50)

## 2021-02-12 MED ORDER — DULOXETINE HCL 60 MG PO CPEP: 60.0000 mg | ORAL_CAPSULE | Freq: Every day | ORAL | 3 refills | Status: DC

## 2021-02-13 LAB — CYTOLOGY - PAP
Adequacy: ABSENT
Comment: NEGATIVE
Diagnosis: NEGATIVE
High risk HPV: NEGATIVE

## 2021-03-06 ENCOUNTER — Ambulatory Visit (HOSPITAL_BASED_OUTPATIENT_CLINIC_OR_DEPARTMENT_OTHER)
Admission: RE | Admit: 2021-03-06 | Discharge: 2021-03-06 | Disposition: A | Discharge status: Home / Self Care | Payer: Managed Care, Other (non HMO) | Source: Ambulatory Visit | Attending: Family Medicine | Admitting: Family Medicine

## 2021-03-06 ENCOUNTER — Encounter (HOSPITAL_BASED_OUTPATIENT_CLINIC_OR_DEPARTMENT_OTHER): Payer: Self-pay

## 2021-03-06 ENCOUNTER — Other Ambulatory Visit: Payer: Self-pay

## 2021-03-06 DIAGNOSIS — Z1231 Encounter for screening mammogram for malignant neoplasm of breast: Secondary | ICD-10-CM | POA: Insufficient documentation

## 2021-05-01 ENCOUNTER — Institutional Professional Consult (permissible substitution): Payer: Managed Care, Other (non HMO) | Admitting: Neurology

## 2021-06-21 ENCOUNTER — Institutional Professional Consult (permissible substitution): Payer: Managed Care, Other (non HMO) | Admitting: Neurology

## 2021-09-11 ENCOUNTER — Encounter: Payer: Self-pay | Admitting: Family Medicine

## 2021-09-12 ENCOUNTER — Encounter: Payer: Self-pay | Admitting: Family Medicine

## 2021-09-12 ENCOUNTER — Telehealth (INDEPENDENT_AMBULATORY_CARE_PROVIDER_SITE_OTHER): Payer: 59 | Admitting: Family Medicine

## 2021-09-12 DIAGNOSIS — L255 Unspecified contact dermatitis due to plants, except food: Secondary | ICD-10-CM

## 2021-09-12 MED ORDER — PREDNISONE 20 MG PO TABS: ORAL_TABLET | ORAL | 0 refills | Status: DC

## 2021-09-12 NOTE — Progress Notes (Signed)
Chief Complaint  ?Patient presents with  ? Poison Ivy  ? ? ?Jenna Mitchell is a 56 y.o. female here for a skin complaint. Due to COVID-19 pandemic, we are interacting via web portal for an electronic face-to-face visit. I verified patient's ID using 2 identifiers. Patient agreed to proceed with visit via this method. Patient is at home, I am at office. Patient and I are present for visit.  ? ?Duration: 5 days ?Location: arm, neck, eye ?Pruritic? Yes ?Painful? No ?Drainage? No ?New soaps/lotions/topicals/detergents? No ?Sick contacts? No ?Exposed to poison ivy ?Other associated symptoms: none ?Therapies tried thus far: Benadryl, OTC cortisone cream ? ?Past Medical History:  ?Diagnosis Date  ? Chicken pox   ? Depression   ? ?Objective ?No conversational dyspnea ?Age appropriate judgment and insight ?Nml affect and mood ?Acuity of video not good enough to discern any lesions ? ?Rhus dermatitis - Plan: predniSONE (DELTASONE) 20 MG tablet ? ?Pred 40 mg/d for 7 d then 20 mg/d for 7 d. Try not to scratch. Add PO antihist 2nd gen.  ?F/u prn. ?The patient voiced understanding and agreement to the plan. ? ?Shelda Pal, DO ?09/12/21 ?3:10 PM ? ?

## 2022-03-30 NOTE — Progress Notes (Addendum)
[facility] at [facility] [address], [address] [place], [place] [address] [phone]  Date:  04/01/2022   Name:  Jenna Mitchell   DOB:  [birth year]   MRN:  [id number]  PCP:  Pearline Cables, MD    Chief Complaint: Annual Exam   History of Present Illness:  Jenna Mitchell is a 56 y.o. very pleasant female patient who presents with the following:  Pt seen today for a CPE Last seen by myself 8/22  Covid booster recommended Tetanus- declines today  Flu shot - declines  Most recent labs one year ago  Pap 8/22- negative Mammo- 8/22 Cologuard last year   Pt has concern of some depression- she takes cymbalta most days 60 mg She notes if she does not take it she is more emotional  For the time being she does not want to change her medication or increase the dose.  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  She describes typical symptoms of carpal tunnel such as numbness and tingling especially in the morning  She is doing some walking for exercise-  She has been working on weight loss and has been experiencing gradual success!   Wt Readings from Last 3 Encounters:  04/01/22 185 lb (83.9 kg)  02/12/21 191 lb 6.4 oz (86.8 kg)  06/05/20 201 lb (91.2 kg)    Patient Active Problem List   Diagnosis Date Noted   Overweight 04/08/2016    Past Medical History:  Diagnosis Date   Chicken pox    Depression     Past Surgical History:  Procedure Laterality Date   TONSILLECTOMY     WISDOM TOOTH EXTRACTION      Social History   Tobacco Use   Smoking status: Never   Smokeless tobacco: Never  Substance Use Topics   Alcohol use: Yes    Comment: social    Drug use: No    Family History  Problem Relation Age of Onset   Multiple sclerosis Mother    Depression  Mother    Heart attack Father     No Known Allergies  Medication list has been reviewed and updated.  Current Outpatient Medications on File Prior to Visit  Medication Sig Dispense Refill   Multiple Vitamin (MULTIVITAMIN) capsule Take 1 capsule by mouth daily.     Multiple Vitamins-Minerals (LUTEIN-ZEAXANTHIN PO)      No current facility-administered medications on file prior to visit.    Review of Systems:  As per HPI- otherwise negative.   Physical Examination: Vitals:   04/01/22 1424  BP: 120/78  Pulse: 80  Resp: 18  Temp: 97.6 F (36.4 C)  SpO2: 96%   Vitals:   04/01/22 1424  Weight: 185 lb (83.9 kg)  Height: 5\' 2"  (1.575 m)   Body mass index is 33.84 kg/m. Ideal Body Weight: Weight in (lb) to have BMI = 25: 136.4  GEN: no acute distress.  Obese, looks well  HEENT: Atraumatic, Normocephalic.  Bilateral TM wnl, oropharynx normal.  PEERL,EOMI.   Ears and Nose: No external deformity. CV: RRR, No M/G/R. No JVD. No thrill. No extra heart sounds. PULM: CTA B, no wheezes, crackles, rhonchi. No retractions. No resp. distress. No accessory muscle use. ABD: S, NT, ND, +BS. No rebound. No HSM. EXTR: No  c/c/e PSYCH: Normally interactive. Conversant.    Assessment and Plan: Physical exam  Screening for thyroid disorder - Plan: TSH  Screening for hyperlipidemia - Plan: Lipid panel  Pre-diabetes - Plan: Comprehensive metabolic panel, Hemoglobin A1c  Fatigue, unspecified type - Plan: CBC, VITAMIN D 25 Hydroxy (Vit-D Deficiency, Fractures)  Anxiety and depression - Plan: DULoxetine (CYMBALTA) 60 MG capsule  Physical exam- encouraged healthy diet and exercise routine Will plan further follow- up pending labs. Praised and encouraged her weight loss efforts For now she like to continue current dose of Cymbalta, refilled  Signed Abbe Amsterdam, MD  Addendum 9/26, received labs as below.  Message to patient. Results for orders placed or performed in visit on  04/01/22  CBC  Result Value Ref Range   WBC 7.6 4.0 - 10.5 K/uL   RBC 4.77 3.87 - 5.11 Mil/uL   Platelets 261.0 150.0 - 400.0 K/uL   Hemoglobin 13.7 12.0 - 15.0 g/dL   HCT 40.9 81.1 - 91.4 %   MCV 84.9 78.0 - 100.0 fl   MCHC 33.7 30.0 - 36.0 g/dL   RDW 78.2 95.6 - 21.3 %  Comprehensive metabolic panel  Result Value Ref Range   Sodium 141 135 - 145 mEq/L   Potassium 4.2 3.5 - 5.1 mEq/L   Chloride 104 96 - 112 mEq/L   CO2 29 19 - 32 mEq/L   Glucose, Bld 137 (H) 70 - 99 mg/dL   BUN 16 6 - 23 mg/dL   Creatinine, Ser 0.86 0.40 - 1.20 mg/dL   Total Bilirubin 1.0 0.2 - 1.2 mg/dL   Alkaline Phosphatase 60 39 - 117 U/L   AST 19 0 - 37 U/L   ALT 21 0 - 35 U/L   Total Protein 6.9 6.0 - 8.3 g/dL   Albumin 4.5 3.5 - 5.2 g/dL   GFR 57.84 (L) >69.62 mL/min   Calcium 9.6 8.4 - 10.5 mg/dL  Hemoglobin X5M  Result Value Ref Range   Hgb A1c MFr Bld 6.3 4.6 - 6.5 %  Lipid panel  Result Value Ref Range   Cholesterol 177 0 - 200 mg/dL   Triglycerides 841.3 0.0 - 149.0 mg/dL   HDL 24.40 >10.27 mg/dL   VLDL 25.3 0.0 - 66.4 mg/dL   LDL Cholesterol 403 (H) 0 - 99 mg/dL   Total CHOL/HDL Ratio 4    NonHDL 126.95   TSH  Result Value Ref Range   TSH 1.30 0.35 - 5.50 uIU/mL  VITAMIN D 25 Hydroxy (Vit-D Deficiency, Fractures)  Result Value Ref Range   VITD 34.29 30.00 - 100.00 ng/mL

## 2022-04-01 ENCOUNTER — Ambulatory Visit (INDEPENDENT_AMBULATORY_CARE_PROVIDER_SITE_OTHER): Payer: 59 | Admitting: Family Medicine

## 2022-04-01 VITALS — BP 120/78 | HR 80 | Temp 97.6°F | Resp 18 | Ht 62.0 in | Wt 185.0 lb

## 2022-04-01 DIAGNOSIS — Z0000 Encounter for general adult medical examination without abnormal findings: Secondary | ICD-10-CM | POA: Diagnosis not present

## 2022-04-01 DIAGNOSIS — R7303 Prediabetes: Secondary | ICD-10-CM

## 2022-04-01 DIAGNOSIS — Z1329 Encounter for screening for other suspected endocrine disorder: Secondary | ICD-10-CM

## 2022-04-01 DIAGNOSIS — R5383 Other fatigue: Secondary | ICD-10-CM

## 2022-04-01 DIAGNOSIS — F419 Anxiety disorder, unspecified: Secondary | ICD-10-CM

## 2022-04-01 DIAGNOSIS — F32A Depression, unspecified: Secondary | ICD-10-CM

## 2022-04-01 DIAGNOSIS — Z13220 Encounter for screening for lipoid disorders: Secondary | ICD-10-CM | POA: Diagnosis not present

## 2022-04-01 MED ORDER — DULOXETINE HCL 60 MG PO CPEP: 60.0000 mg | ORAL_CAPSULE | Freq: Every day | ORAL | 3 refills | Status: DC

## 2022-04-01 NOTE — Patient Instructions (Signed)
It was good to see you today- I will be in touch with your labs Recommend covid, flu and tetanus vaccines!

## 2022-04-02 ENCOUNTER — Encounter: Payer: Self-pay | Admitting: Family Medicine

## 2022-04-02 DIAGNOSIS — F32A Depression, unspecified: Secondary | ICD-10-CM

## 2022-04-02 LAB — CBC
HCT: 40.5 % (ref 36.0–46.0)
Hemoglobin: 13.7 g/dL (ref 12.0–15.0)
MCHC: 33.7 g/dL (ref 30.0–36.0)
MCV: 84.9 fl (ref 78.0–100.0)
Platelets: 261 10*3/uL (ref 150.0–400.0)
RBC: 4.77 Mil/uL (ref 3.87–5.11)
RDW: 13.2 % (ref 11.5–15.5)
WBC: 7.6 10*3/uL (ref 4.0–10.5)

## 2022-04-02 LAB — COMPREHENSIVE METABOLIC PANEL
ALT: 21 U/L (ref 0–35)
AST: 19 U/L (ref 0–37)
Albumin: 4.5 g/dL (ref 3.5–5.2)
Alkaline Phosphatase: 60 U/L (ref 39–117)
BUN: 16 mg/dL (ref 6–23)
CO2: 29 mEq/L (ref 19–32)
Calcium: 9.6 mg/dL (ref 8.4–10.5)
Chloride: 104 mEq/L (ref 96–112)
Creatinine, Ser: 1.06 mg/dL (ref 0.40–1.20)
GFR: 59.84 mL/min — ABNORMAL LOW (ref >60.00)
Glucose, Bld: 137 mg/dL — ABNORMAL HIGH (ref 70–99)
Potassium: 4.2 mEq/L (ref 3.5–5.1)
Sodium: 141 mEq/L (ref 135–145)
Total Bilirubin: 1 mg/dL (ref 0.2–1.2)
Total Protein: 6.9 g/dL (ref 6.0–8.3)

## 2022-04-02 LAB — LIPID PANEL
Cholesterol: 177 mg/dL (ref 0–200)
HDL: 49.8 mg/dL (ref >39.00)
LDL Cholesterol: 106 mg/dL — ABNORMAL HIGH (ref 0–99)
NonHDL: 126.95
Total CHOL/HDL Ratio: 4
Triglycerides: 105 mg/dL (ref 0.0–149.0)
VLDL: 21 mg/dL (ref 0.0–40.0)

## 2022-04-02 LAB — TSH: TSH: 1.3 u[IU]/mL (ref 0.35–5.50)

## 2022-04-02 LAB — HEMOGLOBIN A1C: Hgb A1c MFr Bld: 6.3 % (ref 4.6–6.5)

## 2022-04-02 LAB — VITAMIN D 25 HYDROXY (VIT D DEFICIENCY, FRACTURES): VITD: 34.29 ng/mL (ref 30.00–100.00)

## 2022-04-23 MED ORDER — DULOXETINE HCL 60 MG PO CPEP: 60.0000 mg | ORAL_CAPSULE | Freq: Every day | ORAL | 3 refills | Status: DC

## 2022-06-16 NOTE — Progress Notes (Unsigned)
[place] at [facility] [address] [place], [place] [address] [phone]  Date:  06/17/2022   Name:  Jenna Mitchell   DOB:  05-20-1966   MRN:  [id number]  PCP:  Darreld Mclean, MD    Chief Complaint: No chief complaint on file.   History of Present Illness:  Jenna Mitchell is a 56 y.o. very pleasant female patient who presents with the following:  Here today to check on a dog bite- last visit with myself in September for a CPE  tetanus  Patient Active Problem List   Diagnosis Date Noted   Overweight 04/08/2016    Past Medical History:  Diagnosis Date   Chicken pox    Depression     Past Surgical History:  Procedure Laterality Date   TONSILLECTOMY     WISDOM TOOTH EXTRACTION      Social History   Tobacco Use   Smoking status: Never   Smokeless tobacco: Never  Substance Use Topics   Alcohol use: Yes    Comment: social    Drug use: No    Family History  Problem Relation Age of Onset   Multiple sclerosis Mother    Depression Mother    Heart attack Father     No Known Allergies  Medication list has been reviewed and updated.  Current Outpatient Medications on File Prior to Visit  Medication Sig Dispense Refill   DULoxetine (CYMBALTA) 60 MG capsule Take 1 capsule (60 mg total) by mouth daily. 90 capsule 3   Multiple Vitamin (MULTIVITAMIN) capsule Take 1 capsule by mouth daily.     Multiple Vitamins-Minerals (LUTEIN-ZEAXANTHIN PO)      No current facility-administered medications on file prior to visit.    Review of Systems:  As per HPI- otherwise negative.   Physical Examination: There were no vitals filed for this visit. There were no vitals filed for this visit. There is no height or weight on file to calculate BMI. Ideal Body Weight:    GEN: no acute distress. HEENT: Atraumatic, Normocephalic.  Ears and Nose: No external deformity. CV: RRR, No M/G/R. No JVD. No thrill. No extra  heart sounds. PULM: CTA B, no wheezes, crackles, rhonchi. No retractions. No resp. distress. No accessory muscle use. ABD: S, NT, ND, +BS. No rebound. No HSM. EXTR: No c/c/e PSYCH: Normally interactive. Conversant.    Assessment and Plan: ***  Signed Lamar Blinks, MD

## 2022-06-17 ENCOUNTER — Ambulatory Visit: Payer: 59 | Admitting: Family Medicine

## 2022-06-17 VITALS — BP 122/80 | HR 91 | Temp 97.6°F | Resp 18 | Ht 62.0 in | Wt 187.2 lb

## 2022-06-17 DIAGNOSIS — W540XXA Bitten by dog, initial encounter: Secondary | ICD-10-CM | POA: Diagnosis not present

## 2022-06-17 DIAGNOSIS — Z23 Encounter for immunization: Secondary | ICD-10-CM | POA: Diagnosis not present

## 2022-06-17 DIAGNOSIS — S61452A Open bite of left hand, initial encounter: Secondary | ICD-10-CM

## 2022-06-17 MED ORDER — AMOXICILLIN-POT CLAVULANATE 875-125 MG PO TABS: 1.0000 | ORAL_TABLET | Freq: Two times a day (BID) | ORAL | 0 refills | Status: DC

## 2022-06-17 NOTE — Patient Instructions (Addendum)
I am sorry you got this bite injury!   Tetanus today Start on the augmentin asap- take twice a day for 10 days Keep the wound clean by washing with soap and water, pat dry at least once a day. Would recommend keeping wound covered.  If the wound is not improving please alert me!    Be sure to observe your dog for at least 10 days to make sure he remains healthy.  If any sign of illness take him to your vet right away:  Rabies virus can be excreted in the saliva of infected dogs, cats, and ferrets during illness and/or for a few days before illness or death. A healthy dog, cat, or ferret that bites a person should be confined and observed daily for 10 days. Confinement should be performed in coordination with public health authorities. To avoid mistaking the signs of rabies for possible side effects of vaccination, administration of rabies vaccine to the animal is not recommended during the observation period.  If the confined animal develops any signs of illness, it should be evaluated by a Animal nutritionist. Any illness in the animal should be reported immediately to the local health department. If the animal develops signs suggestive of rabies, it should be euthanized by an animal health professional and the head submitted to a diagnostic laboratory for testing.  I am required by law to report a bite to animal control, but I believe you are able to observe the animal at home or through your vet since his rabies is up to date

## 2023-02-20 ENCOUNTER — Encounter (INDEPENDENT_AMBULATORY_CARE_PROVIDER_SITE_OTHER): Payer: Self-pay

## 2023-02-21 ENCOUNTER — Other Ambulatory Visit: Payer: Self-pay | Admitting: Oncology

## 2023-02-21 DIAGNOSIS — Z006 Encounter for examination for normal comparison and control in clinical research program: Secondary | ICD-10-CM

## 2023-04-03 NOTE — Patient Instructions (Addendum)
Good to see you again today I will be in touch with your labs asap  I do think you may have sleep apnea- [email or web address]   may be a good resource for you to get a home sleep study- might be more affordable for you?  Your finger is infected!  Take the augmentin twice a day- if not IMPROVING over the next day or so please alert me  Recommend flu shot and covid booster this fall   Tetanus is up to date

## 2023-04-03 NOTE — Progress Notes (Unsigned)
[facility] at [facility] [address], [address] [place], [place] [address] [phone]  Date:  04/07/2023   Name:  Jannelle Notaro   DOB:  [birth year]   MRN:  [id number]  PCP:  Pearline Cables, MD    Chief Complaint: No chief complaint on file.   History of Present Illness:  Alajia Schmelzer is a 57 y.o. very pleasant female patient who presents with the following:  Pt seen today for a physical exam -history of overweight, depression Last seen by myself 12/23- at that time she had been bitten by her dog  Mammo 8/22 Pap 8/22, negative Colon- colguard due next year  Labs one year ago   Shingrix done Flu Covid   Currently taking Cymbalta 60 mg daily  Patient Active Problem List   Diagnosis Date Noted   Overweight 04/08/2016    Past Medical History:  Diagnosis Date   Chicken pox    Depression     Past Surgical History:  Procedure Laterality Date   TONSILLECTOMY     WISDOM TOOTH EXTRACTION      Social History   Tobacco Use   Smoking status: Never   Smokeless tobacco: Never  Substance Use Topics   Alcohol use: Yes    Comment: social    Drug use: No    Family History  Problem Relation Age of Onset   Multiple sclerosis Mother    Depression Mother    Heart attack Father     No Known Allergies  Medication list has been reviewed and updated.  Current Outpatient Medications on File Prior to Visit  Medication Sig Dispense Refill   amoxicillin-clavulanate (AUGMENTIN) 875-125 MG tablet Take 1 tablet by mouth 2 (two) times daily. 20 tablet 0   DULoxetine (CYMBALTA) 60 MG capsule Take 1 capsule (60 mg total) by mouth daily. 90 capsule 3   Multiple Vitamin (MULTIVITAMIN) capsule Take 1 capsule by mouth daily.     Multiple Vitamins-Minerals (LUTEIN-ZEAXANTHIN PO)      No current facility-administered medications on file prior to visit.    Review of Systems:  As per HPI- otherwise negative.   Physical  Examination: There were no vitals filed for this visit. There were no vitals filed for this visit. There is no height or weight on file to calculate BMI. Ideal Body Weight:    GEN: no acute distress. HEENT: Atraumatic, Normocephalic.  Ears and Nose: No external deformity. CV: RRR, No M/G/R. No JVD. No thrill. No extra heart sounds. PULM: CTA B, no wheezes, crackles, rhonchi. No retractions. No resp. distress. No accessory muscle use. ABD: S, NT, ND, +BS. No rebound. No HSM. EXTR: No c/c/e PSYCH: Normally interactive. Conversant.    Assessment and Plan: *** Physical exam today- recommend healthy diet and exercise routine Will plan further follow- up pending labs.  Signed Abbe Amsterdam, MD

## 2023-04-07 ENCOUNTER — Ambulatory Visit (INDEPENDENT_AMBULATORY_CARE_PROVIDER_SITE_OTHER): Payer: 59 | Admitting: Family Medicine

## 2023-04-07 ENCOUNTER — Encounter: Payer: Self-pay | Admitting: Family Medicine

## 2023-04-07 VITALS — BP 124/80 | HR 76 | Temp 97.9°F | Resp 18 | Ht 62.0 in | Wt 194.8 lb

## 2023-04-07 DIAGNOSIS — F32A Depression, unspecified: Secondary | ICD-10-CM

## 2023-04-07 DIAGNOSIS — R7303 Prediabetes: Secondary | ICD-10-CM

## 2023-04-07 DIAGNOSIS — Z0001 Encounter for general adult medical examination with abnormal findings: Secondary | ICD-10-CM | POA: Diagnosis not present

## 2023-04-07 DIAGNOSIS — R5383 Other fatigue: Secondary | ICD-10-CM | POA: Diagnosis not present

## 2023-04-07 DIAGNOSIS — W5501XA Bitten by cat, initial encounter: Secondary | ICD-10-CM

## 2023-04-07 DIAGNOSIS — Z0000 Encounter for general adult medical examination without abnormal findings: Secondary | ICD-10-CM

## 2023-04-07 DIAGNOSIS — Z130 Encounter for screening for diseases of the blood and blood-forming organs and certain disorders involving the immune mechanism: Secondary | ICD-10-CM

## 2023-04-07 DIAGNOSIS — Z1329 Encounter for screening for other suspected endocrine disorder: Secondary | ICD-10-CM

## 2023-04-07 DIAGNOSIS — F419 Anxiety disorder, unspecified: Secondary | ICD-10-CM | POA: Diagnosis not present

## 2023-04-07 DIAGNOSIS — R350 Frequency of micturition: Secondary | ICD-10-CM

## 2023-04-07 DIAGNOSIS — Z1231 Encounter for screening mammogram for malignant neoplasm of breast: Secondary | ICD-10-CM

## 2023-04-07 DIAGNOSIS — Z13220 Encounter for screening for lipoid disorders: Secondary | ICD-10-CM

## 2023-04-07 DIAGNOSIS — S61259A Open bite of unspecified finger without damage to nail, initial encounter: Secondary | ICD-10-CM

## 2023-04-07 LAB — CBC
HCT: 42.6 % (ref 36.0–46.0)
Hemoglobin: 14.1 g/dL (ref 12.0–15.0)
MCHC: 33.2 g/dL (ref 30.0–36.0)
MCV: 85.9 fL (ref 78.0–100.0)
Platelets: 291 10*3/uL (ref 150.0–400.0)
RBC: 4.96 Mil/uL (ref 3.87–5.11)
RDW: 13 % (ref 11.5–15.5)
WBC: 9.4 10*3/uL (ref 4.0–10.5)

## 2023-04-07 LAB — LIPID PANEL
Cholesterol: 199 mg/dL (ref 0–200)
HDL: 56.3 mg/dL (ref >39.00)
LDL Cholesterol: 119 mg/dL — ABNORMAL HIGH (ref 0–99)
NonHDL: 142.77
Total CHOL/HDL Ratio: 4
Triglycerides: 120 mg/dL (ref 0.0–149.0)
VLDL: 24 mg/dL (ref 0.0–40.0)

## 2023-04-07 LAB — COMPREHENSIVE METABOLIC PANEL
ALT: 22 U/L (ref 0–35)
AST: 19 U/L (ref 0–37)
Albumin: 4.2 g/dL (ref 3.5–5.2)
Alkaline Phosphatase: 65 U/L (ref 39–117)
BUN: 14 mg/dL (ref 6–23)
CO2: 27 meq/L (ref 19–32)
Calcium: 9.2 mg/dL (ref 8.4–10.5)
Chloride: 105 meq/L (ref 96–112)
Creatinine, Ser: 0.83 mg/dL (ref 0.40–1.20)
GFR: 79.69 mL/min (ref >60.00)
Glucose, Bld: 105 mg/dL — ABNORMAL HIGH (ref 70–99)
Potassium: 4.3 meq/L (ref 3.5–5.1)
Sodium: 141 meq/L (ref 135–145)
Total Bilirubin: 0.9 mg/dL (ref 0.2–1.2)
Total Protein: 6.5 g/dL (ref 6.0–8.3)

## 2023-04-07 LAB — TSH: TSH: 2.71 u[IU]/mL (ref 0.35–5.50)

## 2023-04-07 LAB — HEMOGLOBIN A1C: Hgb A1c MFr Bld: 6.3 % (ref 4.6–6.5)

## 2023-04-07 LAB — VITAMIN B12: Vitamin B-12: 343 pg/mL (ref 211–911)

## 2023-04-07 LAB — VITAMIN D 25 HYDROXY (VIT D DEFICIENCY, FRACTURES): VITD: 40.95 ng/mL (ref 30.00–100.00)

## 2023-04-07 MED ORDER — AMOXICILLIN-POT CLAVULANATE 875-125 MG PO TABS
1.0000 | ORAL_TABLET | Freq: Two times a day (BID) | ORAL | 0 refills | Status: DC
Start: 2023-04-07 — End: 2023-09-10

## 2023-04-07 MED ORDER — DULOXETINE HCL 60 MG PO CPEP
60.0000 mg | ORAL_CAPSULE | Freq: Every day | ORAL | 3 refills | Status: DC
Start: 2023-04-07 — End: 2023-06-17

## 2023-04-14 ENCOUNTER — Encounter: Payer: Self-pay | Admitting: Family Medicine

## 2023-04-14 ENCOUNTER — Telehealth: Payer: Self-pay | Admitting: Family Medicine

## 2023-04-14 NOTE — Telephone Encounter (Signed)
I did not receive bite report form from our visit last Monday, called again and was faxed form which I completed today

## 2023-04-18 ENCOUNTER — Telehealth (HOSPITAL_BASED_OUTPATIENT_CLINIC_OR_DEPARTMENT_OTHER): Payer: Self-pay

## 2023-06-16 ENCOUNTER — Encounter: Payer: Self-pay | Admitting: Family Medicine

## 2023-06-16 DIAGNOSIS — F419 Anxiety disorder, unspecified: Secondary | ICD-10-CM

## 2023-06-17 MED ORDER — DULOXETINE HCL 60 MG PO CPEP
60.0000 mg | ORAL_CAPSULE | Freq: Every day | ORAL | 3 refills | Status: DC
Start: 2023-06-17 — End: 2024-05-04

## 2023-08-19 IMAGING — MG MM DIGITAL SCREENING BILAT W/ TOMO AND CAD
4 series · 4 of 12 positions shown · non-contrast
Comparison: Previous exam(s).

ACR Breast Density Category a: The breast tissue is almost entirely
fatty.

CLINICAL DATA: Screening.

EXAM:
DIGITAL SCREENING BILATERAL MAMMOGRAM WITH TOMOSYNTHESIS AND CAD
TECHNIQUE: Bilateral screening digital craniocaudal and mediolateral oblique
mammograms were obtained. Bilateral screening digital breast
tomosynthesis was performed. The images were evaluated with
computer-aided detection.

[R MLO synth-2D]
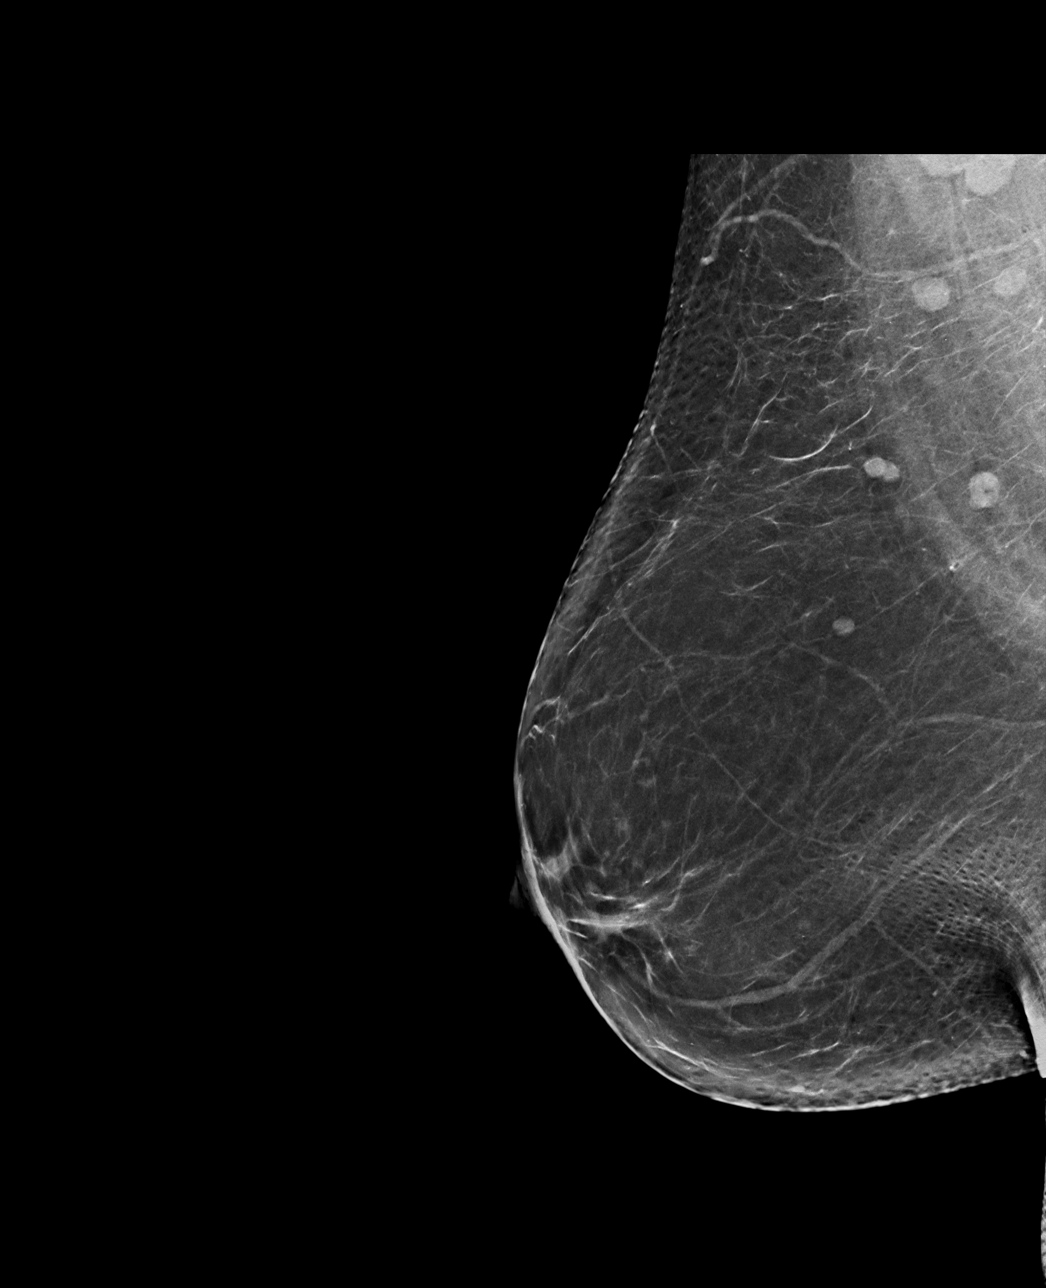

[L CC synth-2D]
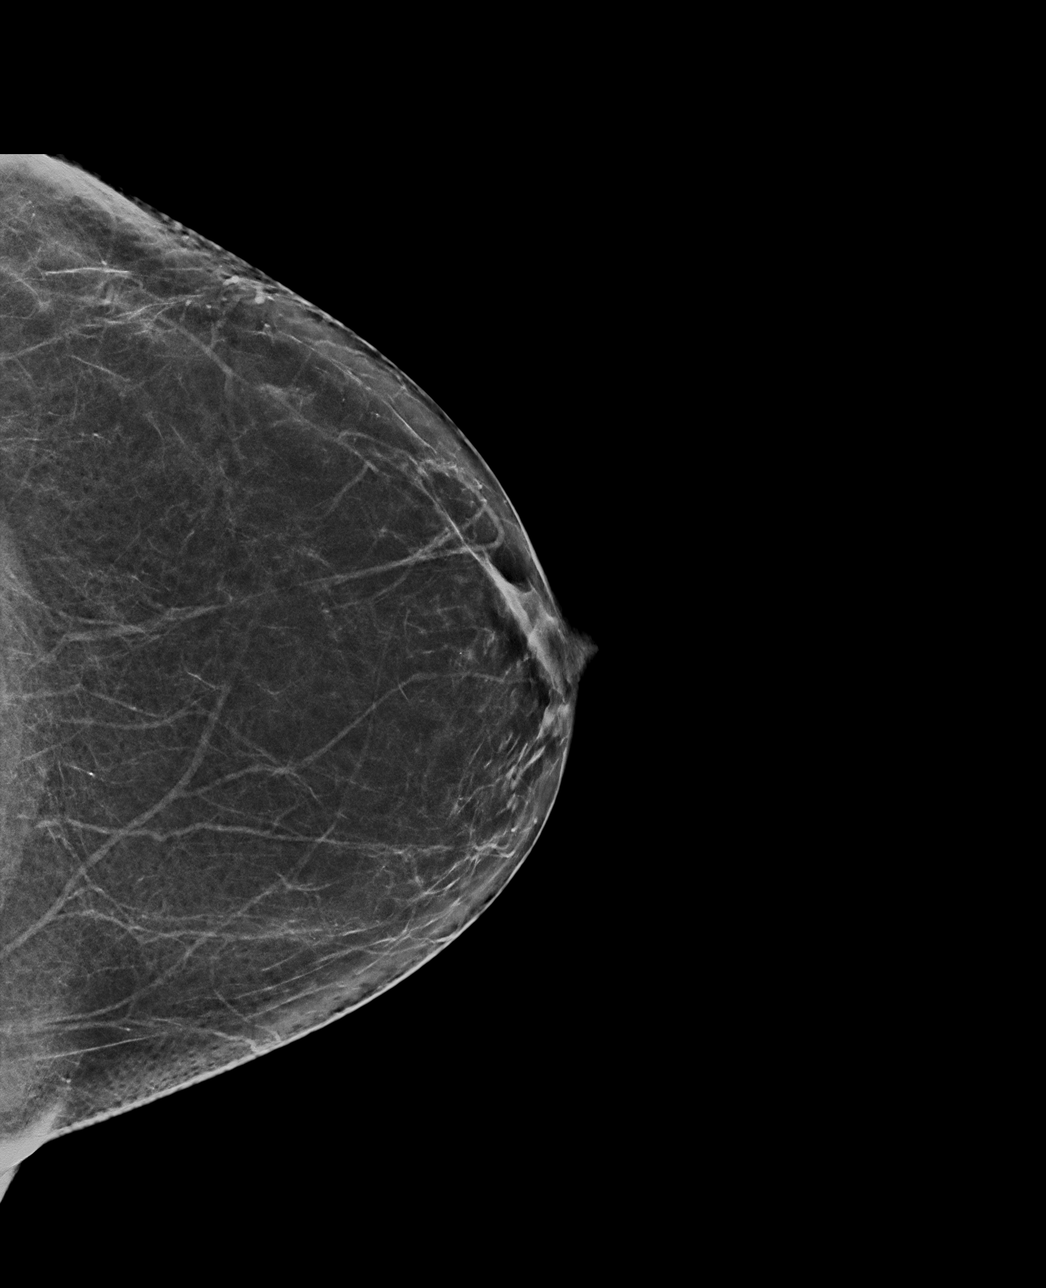

[R MLO tomo · tomo slice 39/78.0]
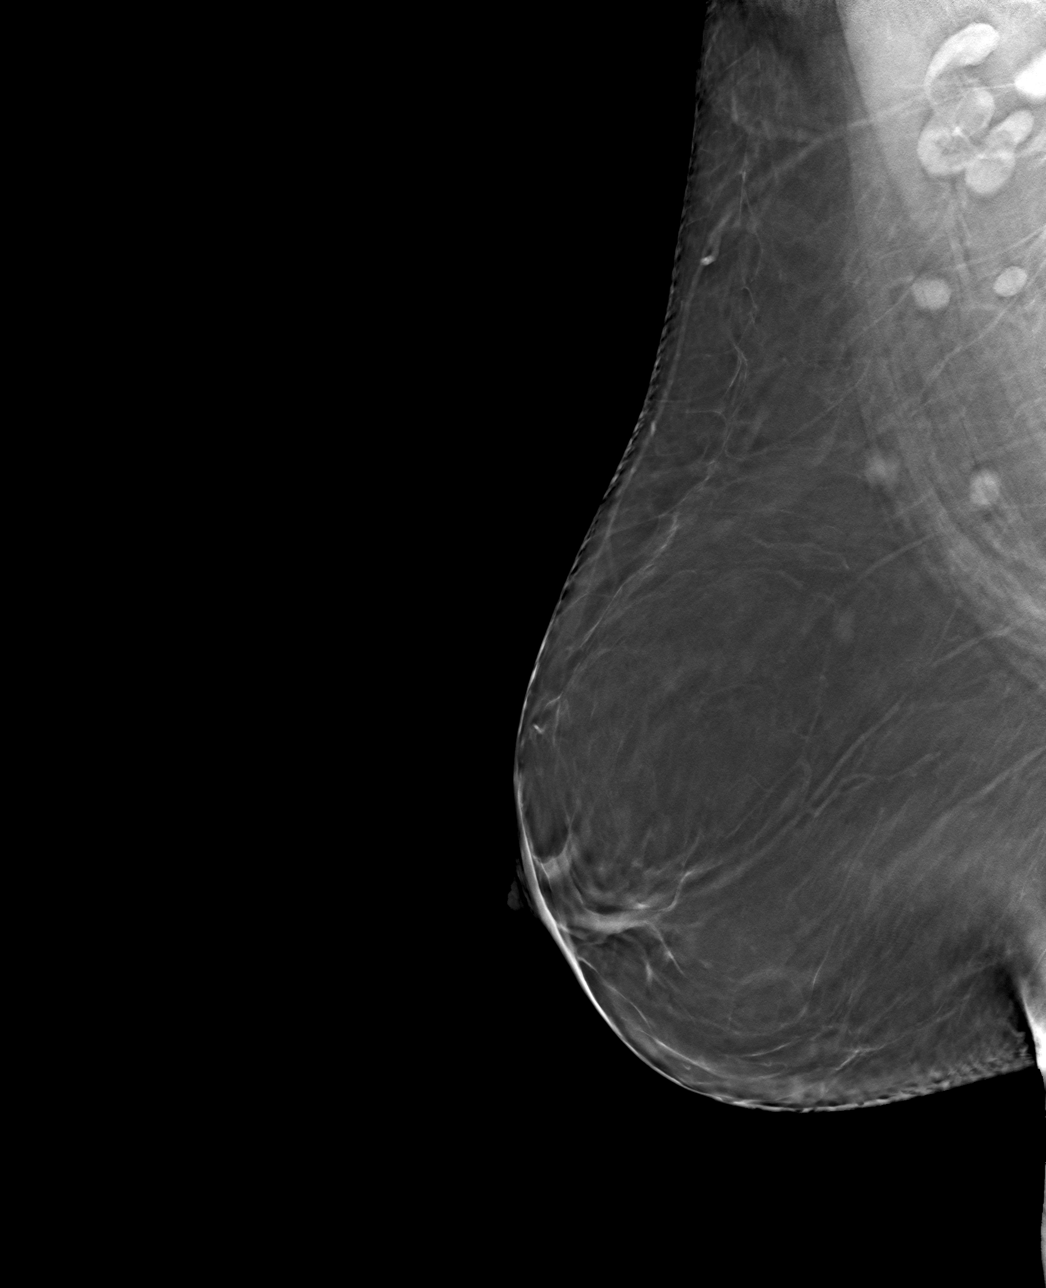

[L CC tomo · tomo slice 34/67.0]
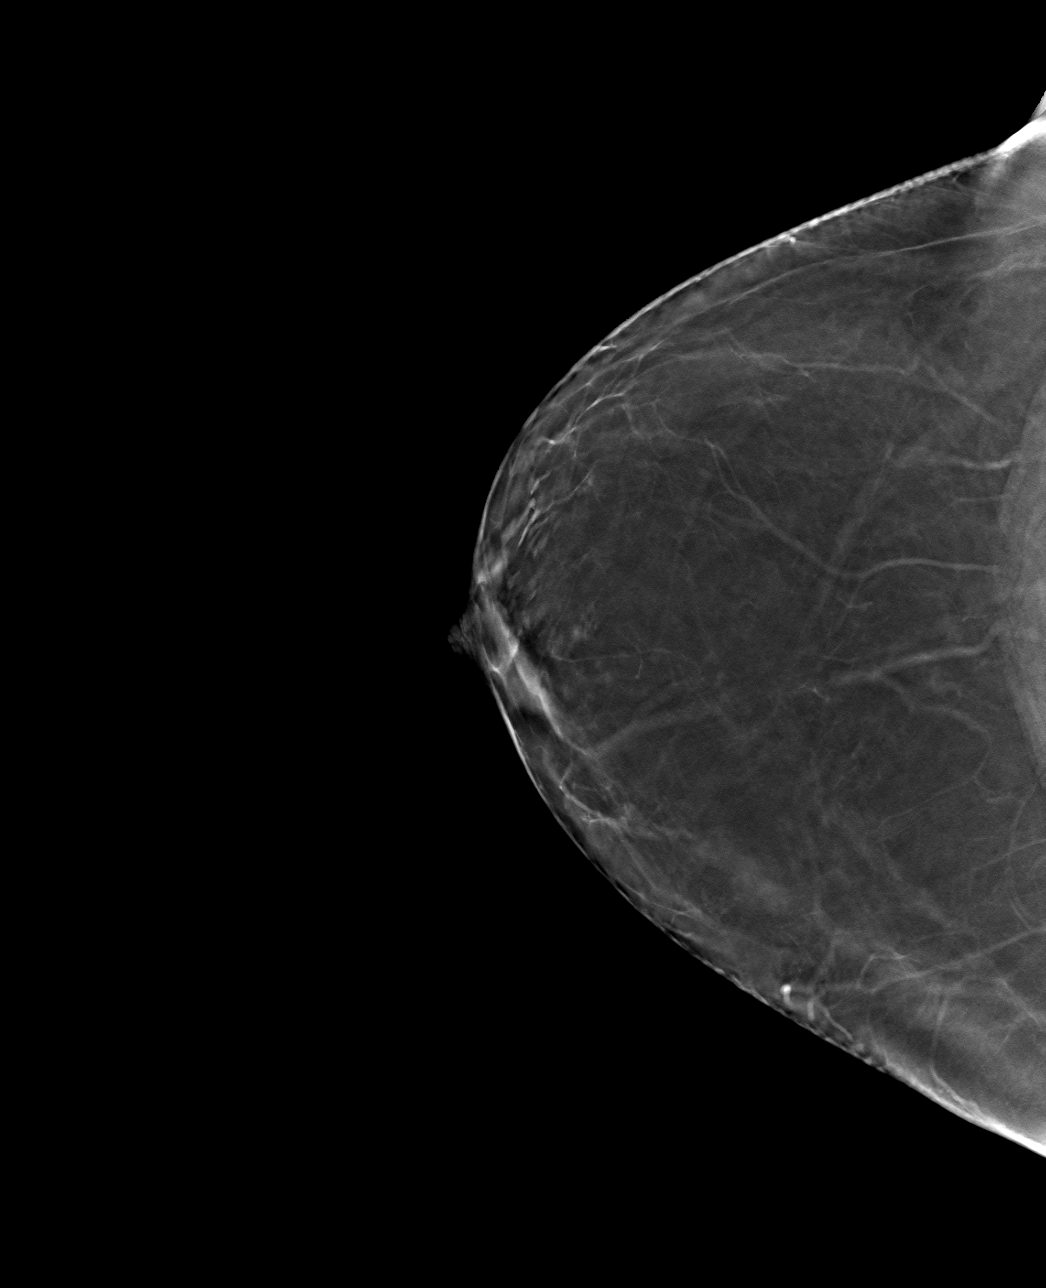

[4 of 12 positions shown; findings below may reference images not displayed]

FINDINGS: There are no findings suspicious for malignancy.
IMPRESSION: No mammographic evidence of malignancy. A result letter of this
screening mammogram will be mailed directly to the patient.

RECOMMENDATION:
Screening mammogram in one year. (Code:0E-3-N98)

BI-RADS CATEGORY  1: Negative.

## 2023-09-04 NOTE — Progress Notes (Addendum)
 [facility] at [facility] [address] [place], [place] [address] [phone]  Date:  09/10/2023   Name:  Jenna Mitchell   DOB:  10-14-1965   MRN:  [id number]  PCP:  Pearline Cables, MD    Chief Complaint: Fatigue (Feeling very tired, no interest in talking to people, random headaches, irritable and short tempered. Pt says she noticed th changes about 6-8 months ago. Annamarie Major: overdue/Flu shot today: declines/HIV screen due)   History of Present Illness:  Jenna Mitchell is a 58 y.o. very pleasant female patient who presents with the following:  Pt seen today with concern of fatigue and mood changes Last seen by myself in September History of depression and overweight She is currently taking Cymbalta 60 mg-I do not see any other antidepressant medications on her med history list She did use lexapro in the distant past but did not work well for her   She notes a urinary odor and her urine appears dark- would like to rule out a UTI   She notes difficulty losing weight esp over the last few years  May feel nauseated on occasion and also has vomited a couple of times Taking a tension headache pill or eating helps with his   Her LMP was 4 years ago- when she stopped her OCP her menses ended  She notes difficulty with sleep, however on the weekend she may sleep 12+ hours and feel better She would not say she is depressed but she does not feel "great"   She notes a difficult time affording a sleep apnea test Her dentist had her try a mouth guard but she did not tolerate  No SI  She also notes BRB on the TP after BM often over the last 6 months Never did a colon- Cologuard 7/22- negative   Patient Active Problem List   Diagnosis Date Noted   Overweight 04/08/2016    Past Medical History:  Diagnosis Date   Chicken pox    Depression     Past Surgical History:  Procedure Laterality Date   TONSILLECTOMY     WISDOM TOOTH  EXTRACTION      Social History   Tobacco Use   Smoking status: Never   Smokeless tobacco: Never  Substance Use Topics   Alcohol use: Yes    Comment: social    Drug use: No    Family History  Problem Relation Age of Onset   Multiple sclerosis Mother    Depression Mother    Heart attack Father     No Known Allergies  Medication list has been reviewed and updated.  Current Outpatient Medications on File Prior to Visit  Medication Sig Dispense Refill   DULoxetine (CYMBALTA) 60 MG capsule Take 1 capsule (60 mg total) by mouth daily. 90 capsule 3   Multiple Vitamin (MULTIVITAMIN) capsule Take 1 capsule by mouth daily.     No current facility-administered medications on file prior to visit.    Review of Systems:  As per HPI- otherwise negative.   Physical Examination: Vitals:   09/10/23 1547  BP: 120/80  Pulse: 86  Resp: 18  Temp: 97.8 F (36.6 C)  SpO2: 97%   Vitals:   09/10/23 1547  Weight: 201 lb 6.4 oz (91.4 kg)  Height: 5\' 2"  (1.575 m)   Body mass index is 36.84 kg/m. Ideal Body Weight: Weight in (lb) to have BMI = 25: 136.4  GEN: no acute distress. Obese,  looks well  HEENT: Atraumatic, Normocephalic.  Ears and Nose: No external deformity. CV: RRR, No M/G/R. No JVD. No thrill. No extra heart sounds. PULM: CTA B, no wheezes, crackles, rhonchi. No retractions. No resp. distress. No accessory muscle use. ABD: S, NT, ND. No rebound. No HSM. EXTR: No c/c/e PSYCH: Normally interactive. Conversant.    Assessment and Plan: Blood in stool - Plan: Ambulatory referral to Gastroenterology  Screening for colon cancer - Plan: Ambulatory referral to Gastroenterology  Pre-diabetes - Plan: Hemoglobin A1c, Basic metabolic panel  Screening for thyroid disorder - Plan: TSH  Other fatigue - Plan: CBC, Vitamin B12, Basic metabolic panel  Abnormal urine odor - Plan: Urine Culture  Encounter for screening mammogram for malignant neoplasm of breast - Plan: MM 3D  SCREENING MAMMOGRAM BILATERAL BREAST Pt seen today with likely post- menopausal sleep and weight changes.  Discussed today Labs as above to help rule out another issue Referral to GI Urine culture Order mammo  Will plan further follow- up pending labs.   Signed Abbe Amsterdam, MD  Addendum 3/6, received labs as below.  Message to patient  Results for orders placed or performed in visit on 09/10/23  Urine Culture   Collection Time: 09/10/23  4:13 PM   Specimen: Urine  Result Value Ref Range   MICRO NUMBER: [id number]    SPECIMEN QUALITY: Adequate    Sample Source NOT GIVEN    STATUS: FINAL    Result: No Growth   CBC   Collection Time: 09/10/23  4:13 PM  Result Value Ref Range   WBC 10.3 4.0 - 10.5 K/uL   RBC 5.15 (H) 3.87 - 5.11 Mil/uL   Platelets 310.0 150.0 - 400.0 K/uL   Hemoglobin 15.1 (H) 12.0 - 15.0 g/dL   HCT 78.2 95.6 - 21.3 %   MCV 86.4 78.0 - 100.0 fl   MCHC 33.8 30.0 - 36.0 g/dL   RDW 08.6 57.8 - 46.9 %  Hemoglobin A1c   Collection Time: 09/10/23  4:13 PM  Result Value Ref Range   Hgb A1c MFr Bld 6.5 4.6 - 6.5 %  TSH   Collection Time: 09/10/23  4:13 PM  Result Value Ref Range   TSH 2.43 0.35 - 5.50 uIU/mL  Vitamin B12   Collection Time: 09/10/23  4:13 PM  Result Value Ref Range   Vitamin B-12 384 211 - 911 pg/mL  Basic metabolic panel   Collection Time: 09/10/23  4:13 PM  Result Value Ref Range   Sodium 140 135 - 145 mEq/L   Potassium 4.4 3.5 - 5.1 mEq/L   Chloride 101 96 - 112 mEq/L   CO2 29 19 - 32 mEq/L   Glucose, Bld 96 70 - 99 mg/dL   BUN 14 6 - 23 mg/dL   Creatinine, Ser 6.29 0.40 - 1.20 mg/dL   GFR 52.84 >13.24 mL/min   Calcium 9.9 8.4 - 10.5 mg/dL   Addendum 3/7, received urine culture which is negative-message to patient

## 2023-09-10 ENCOUNTER — Ambulatory Visit: Payer: 59 | Admitting: Family Medicine

## 2023-09-10 VITALS — BP 120/80 | HR 86 | Temp 97.8°F | Resp 18 | Ht 62.0 in | Wt 201.4 lb

## 2023-09-10 DIAGNOSIS — K921 Melena: Secondary | ICD-10-CM

## 2023-09-10 DIAGNOSIS — Z1211 Encounter for screening for malignant neoplasm of colon: Secondary | ICD-10-CM

## 2023-09-10 DIAGNOSIS — Z1231 Encounter for screening mammogram for malignant neoplasm of breast: Secondary | ICD-10-CM

## 2023-09-10 DIAGNOSIS — R7303 Prediabetes: Secondary | ICD-10-CM

## 2023-09-10 DIAGNOSIS — R5383 Other fatigue: Secondary | ICD-10-CM

## 2023-09-10 DIAGNOSIS — Z1329 Encounter for screening for other suspected endocrine disorder: Secondary | ICD-10-CM | POA: Diagnosis not present

## 2023-09-10 DIAGNOSIS — R8290 Unspecified abnormal findings in urine: Secondary | ICD-10-CM

## 2023-09-10 NOTE — Patient Instructions (Signed)
 It was good to see you today- I will be in touch with your labs asap I will also get you set up with GI for a colonoscopy to make sure no issue with the bleeding you have noticed

## 2023-09-11 ENCOUNTER — Encounter: Payer: Self-pay | Admitting: Family Medicine

## 2023-09-11 LAB — VITAMIN B12: Vitamin B-12: 384 pg/mL (ref 211–911)

## 2023-09-11 LAB — BASIC METABOLIC PANEL
BUN: 14 mg/dL (ref 6–23)
CO2: 29 meq/L (ref 19–32)
Calcium: 9.9 mg/dL (ref 8.4–10.5)
Chloride: 101 meq/L (ref 96–112)
Creatinine, Ser: 0.89 mg/dL (ref 0.40–1.20)
GFR: 73.06 mL/min (ref >60.00)
Glucose, Bld: 96 mg/dL (ref 70–99)
Potassium: 4.4 meq/L (ref 3.5–5.1)
Sodium: 140 meq/L (ref 135–145)

## 2023-09-11 LAB — URINE CULTURE
MICRO NUMBER:: 16162532
Result:: NO GROWTH
SPECIMEN QUALITY:: ADEQUATE

## 2023-09-11 LAB — CBC
HCT: 44.5 % (ref 36.0–46.0)
Hemoglobin: 15.1 g/dL — ABNORMAL HIGH (ref 12.0–15.0)
MCHC: 33.8 g/dL (ref 30.0–36.0)
MCV: 86.4 fl (ref 78.0–100.0)
Platelets: 310 10*3/uL (ref 150.0–400.0)
RBC: 5.15 Mil/uL — ABNORMAL HIGH (ref 3.87–5.11)
RDW: 13.1 % (ref 11.5–15.5)
WBC: 10.3 10*3/uL (ref 4.0–10.5)

## 2023-09-11 LAB — TSH: TSH: 2.43 u[IU]/mL (ref 0.35–5.50)

## 2023-09-11 LAB — HEMOGLOBIN A1C: Hgb A1c MFr Bld: 6.5 % (ref 4.6–6.5)

## 2023-09-12 ENCOUNTER — Encounter: Payer: Self-pay | Admitting: Family Medicine

## 2023-09-18 ENCOUNTER — Encounter (HOSPITAL_BASED_OUTPATIENT_CLINIC_OR_DEPARTMENT_OTHER): Payer: Self-pay

## 2023-09-18 ENCOUNTER — Ambulatory Visit (HOSPITAL_BASED_OUTPATIENT_CLINIC_OR_DEPARTMENT_OTHER)
Admission: RE | Admit: 2023-09-18 | Discharge: 2023-09-18 | Disposition: A | Discharge status: Home / Self Care | Source: Ambulatory Visit | Attending: Family Medicine | Admitting: Family Medicine

## 2023-09-18 DIAGNOSIS — Z1231 Encounter for screening mammogram for malignant neoplasm of breast: Secondary | ICD-10-CM | POA: Diagnosis present

## 2024-04-01 LAB — COLOGUARD: COLOGUARD: NEGATIVE

## 2024-04-20 NOTE — Progress Notes (Addendum)
 [facility] at [facility] [address], [address] [place], [place] [address] [phone]  Date:  04/22/2024   Name:  Jenna Mitchell   DOB:  13-Mar-1966   MRN:  [id number]  PCP:  Watt Harlene BROCKS, MD    Chief Complaint: Annual Exam   History of Present Illness:  Jenna Mitchell is a 58 y.o. very pleasant female patient who presents with the following:  Patient seen today for physical exam, and to discuss mood and weight management.  I saw her most recently in March At that time she was dealing with postmenopausal sleep and weight changes.  I got some lab work for her-history of prediabetes, however her most recent A1c was 6.5%-I offered to start her on metformin Otherwise she is currently taking Cymbalta   -Flu shot- declines today  -Recommend COVID booster -Shingrix is complete  Wt Readings from Last 3 Encounters:  04/22/24 192 lb 3.2 oz (87.2 kg)  09/10/23 201 lb 6.4 oz (91.4 kg)  04/07/23 194 lb 12.8 oz (88.4 kg)    BP Readings from Last 3 Encounters:  04/22/24 130/80  09/10/23 120/80  04/07/23 124/80    Discussed the use of AI scribe software for clinical note transcription with the patient, who gave verbal consent to proceed.  History of Present Illness Jenna Mitchell is a 58 year old female who presents with recurrent falls.  She has experienced two falls, one in July and another in August, without any preceding symptoms such as dizziness, sickness, or loss of consciousness. The first fall occurred while reaching for her car door handle, and the second while opening her front door. No palpitations, chest pain, blurred vision, or dizziness were noted during these events.  She is really not sure what happened, but it does not sound like she passed out or had hypotensive symptoms.  There was no prodrome or aftermath from these episodes.  She may have simply tripped and fallen, she is not really sure.  She did land on her knee 1 time  and bruised her pretty badly She has a family history of multiple sclerosis, as her mother has the condition, which raises concerns about her own health. Friends have suggested that low blood pressure might be a cause, but she does not have a blood pressure cuff at home to monitor it.  She has a history of blacking out in her late twenties, particularly in the shower, but notes that this has not occurred for over thirty years.  She is trying to lose weight and has started using natural patches for puffiness and digestion, which have helped her have more regular bowel movements. She typically experiences constipation, having bowel movements only once every couple of weeks unless she consumes dairy, which causes diarrhea.  She is currently taking Cymbalta  and has noticed increased irritability and anger recently.  She notes that friends and family tend to lean on her as now she feels overwhelmed and annoyed.  She has tried Lexapro previously and it did not work very well, she also did not have success with Prozac.  She is tolerating Cymbalta  well, she would be willing to try a higher dose  She reports difficulty sleeping, often unable to fall asleep until late at night despite going to bed early. She has ordered an at-home sleep test to investigate potential sleep apnea, as she struggles with fatigue due to her early work schedule.  She has been trying to exercise more, especially on the  weekends.  Her work schedule makes it hard for her to exercise during the week  She declines a flu shot today Patient Active Problem List   Diagnosis Date Noted   Overweight 04/08/2016    Past Medical History:  Diagnosis Date   Chicken pox    Depression     Past Surgical History:  Procedure Laterality Date   TONSILLECTOMY     WISDOM TOOTH EXTRACTION      Social History   Tobacco Use   Smoking status: Never   Smokeless tobacco: Never  Substance Use Topics   Alcohol use: Yes    Comment: social     Drug use: No    Family History  Problem Relation Age of Onset   Multiple sclerosis Mother    Depression Mother    Heart attack Father     No Known Allergies  Medication list has been reviewed and updated.  Current Outpatient Medications on File Prior to Visit  Medication Sig Dispense Refill   DULoxetine  (CYMBALTA ) 60 MG capsule Take 1 capsule (60 mg total) by mouth daily. 90 capsule 3   Multiple Vitamin (MULTIVITAMIN) capsule Take 1 capsule by mouth daily.     No current facility-administered medications on file prior to visit.    Review of Systems:  As per HPI- otherwise negative.   Physical Examination: Vitals:   04/22/24 1545  BP: 130/80  Pulse: 83  Temp: 97.9 F (36.6 C)  SpO2: 95%   Vitals:   04/22/24 1545  Weight: 192 lb 3.2 oz (87.2 kg)  Height: 5' 2 (1.575 m)   Body mass index is 35.15 kg/m. Ideal Body Weight: Weight in (lb) to have BMI = 25: 136.4  GEN: no acute distress.  Obese, looks well  HEENT: Atraumatic, Normocephalic. Bilateral TM wnl, oropharynx normal.  PEERL,EOMI.   Ears and Nose: No external deformity. CV: RRR, No M/G/R. No JVD. No thrill. No extra heart sounds. PULM: CTA B, no wheezes, crackles, rhonchi. No retractions. No resp. distress. No accessory muscle use. ABD: S, NT, ND, +BS. No rebound. No HSM. EXTR: No c/c/e PSYCH: Normally interactive. Conversant.  Pap collected today.  Normal vulva, vagina, cervix  Assessment and Plan: Physical exam  Screening, lipid - Plan: Lipid panel  Screening for diabetes mellitus - Plan: Hemoglobin A1c, Comprehensive metabolic panel with GFR  Screening for cervical cancer - Plan: Cytology - PAP  Screening for deficiency anemia - Plan: CBC  Assessment & Plan Adult Wellness Visit Routine wellness visit with normal blood pressure. Recent falls possibly due to low blood pressure or balance issues. - Perform Pap smear. - Order blood counts, metabolic profile, cholesterol, and A1c. - Discuss  exercise and weight management. - Encourage monitoring and reporting of falls if she continues.  Menopausal symptoms Experiencing menopausal symptoms including irritability and difficulty sleeping.  She is not interested in HRT irritability associated with menopausal symptoms Increased irritability possibly related to menopausal changes. Current medication is Duloxetine  60 mg daily. - Increase Duloxetine  to 120 mg daily for a trial period. - Monitor response to increased dosage and adjust prescription if effective.  Insomnia Difficulty falling asleep and staying asleep. Considering at-home sleep test for sleep apnea. - Try at-home sleep test from [facility] for sleep apnea.  Constipation Chronic constipation with improvement noted using natural patches for digestion.  Overweight Weight decreased since March, but struggles to lose more than 13 pounds. Engaging in some exercise, but energy levels are low after work. - Encourage continued exercise and weight  management efforts.  Borderline diabetes mellitus Previous A1c was 6.5, indicating borderline diabetes. Monitoring blood sugar levels is necessary. - Order A1c test to monitor blood sugar levels.  We discussed these 2 falls that she had over the summer.  She is really not sure what happened, she may have tripped but is not certain.  She has felt fine last couple of months.  We discussed getting some further evaluation such as an EKG, Zio patch.  I also offered to have her seen by neurology.  For the moment she would prefer to see how things go, she will let me know if she has any more falls or other concerning symptoms Signed Harlene Schroeder, MD  Received labs 10/17- message to pt  Results for orders placed or performed in visit on 04/22/24  Hemoglobin A1c   Collection Time: 04/22/24  4:19 PM  Result Value Ref Range   Hgb A1c MFr Bld 6.3 4.6 - 6.5 %  CBC   Collection Time: 04/22/24  4:19 PM  Result Value Ref Range   WBC 10.2 4.0  - 10.5 K/uL   RBC 5.31 (H) 3.87 - 5.11 Mil/uL   Platelets 324.0 150.0 - 400.0 K/uL   Hemoglobin 15.2 (H) 12.0 - 15.0 g/dL   HCT 54.5 63.9 - 53.9 %   MCV 85.5 78.0 - 100.0 fl   MCHC 33.5 30.0 - 36.0 g/dL   RDW 86.9 88.4 - 84.4 %  Comprehensive metabolic panel with GFR   Collection Time: 04/22/24  4:19 PM  Result Value Ref Range   Sodium 140 135 - 145 mEq/L   Potassium 4.2 3.5 - 5.1 mEq/L   Chloride 101 96 - 112 mEq/L   CO2 29 19 - 32 mEq/L   Glucose, Bld 91 70 - 99 mg/dL   BUN 12 6 - 23 mg/dL   Creatinine, Ser 9.02 0.40 - 1.20 mg/dL   Total Bilirubin 1.2 0.2 - 1.2 mg/dL   Alkaline Phosphatase 64 39 - 117 U/L   AST 25 0 - 37 U/L   ALT 28 0 - 35 U/L   Total Protein 7.4 6.0 - 8.3 g/dL   Albumin 4.7 3.5 - 5.2 g/dL   GFR 34.38 >39.99 mL/min   Calcium 9.4 8.4 - 10.5 mg/dL  Lipid panel   Collection Time: 04/22/24  4:19 PM  Result Value Ref Range   Cholesterol 214 (H) 0 - 200 mg/dL   Triglycerides 854.9 0.0 - 149.0 mg/dL   HDL 48.89 >60.99 mg/dL   VLDL 70.9 0.0 - 59.9 mg/dL   LDL Cholesterol 865 (H) 0 - 99 mg/dL   Total CHOL/HDL Ratio 4    NonHDL 162.82

## 2024-04-20 NOTE — Patient Instructions (Incomplete)
 It was good to see you today!

## 2024-04-22 ENCOUNTER — Other Ambulatory Visit (HOSPITAL_COMMUNITY)
Admission: RE | Admit: 2024-04-22 | Discharge: 2024-04-22 | Disposition: A | Discharge status: Home / Self Care | Source: Ambulatory Visit | Attending: Family Medicine | Admitting: Family Medicine

## 2024-04-22 ENCOUNTER — Encounter: Payer: Self-pay | Admitting: Family Medicine

## 2024-04-22 ENCOUNTER — Ambulatory Visit (INDEPENDENT_AMBULATORY_CARE_PROVIDER_SITE_OTHER): Admitting: Family Medicine

## 2024-04-22 VITALS — BP 130/80 | HR 83 | Temp 97.9°F | Ht 62.0 in | Wt 192.2 lb

## 2024-04-22 DIAGNOSIS — Z0000 Encounter for general adult medical examination without abnormal findings: Secondary | ICD-10-CM

## 2024-04-22 DIAGNOSIS — Z130 Encounter for screening for diseases of the blood and blood-forming organs and certain disorders involving the immune mechanism: Secondary | ICD-10-CM

## 2024-04-22 DIAGNOSIS — Z131 Encounter for screening for diabetes mellitus: Secondary | ICD-10-CM | POA: Diagnosis not present

## 2024-04-22 DIAGNOSIS — Z124 Encounter for screening for malignant neoplasm of cervix: Secondary | ICD-10-CM

## 2024-04-22 DIAGNOSIS — Z13220 Encounter for screening for lipoid disorders: Secondary | ICD-10-CM

## 2024-04-23 ENCOUNTER — Encounter: Payer: Self-pay | Admitting: Family Medicine

## 2024-04-23 LAB — HEMOGLOBIN A1C: Hgb A1c MFr Bld: 6.3 % (ref 4.6–6.5)

## 2024-04-23 LAB — CBC
HCT: 45.4 % (ref 36.0–46.0)
Hemoglobin: 15.2 g/dL — ABNORMAL HIGH (ref 12.0–15.0)
MCHC: 33.5 g/dL (ref 30.0–36.0)
MCV: 85.5 fl (ref 78.0–100.0)
Platelets: 324 K/uL (ref 150.0–400.0)
RBC: 5.31 Mil/uL — ABNORMAL HIGH (ref 3.87–5.11)
RDW: 13 % (ref 11.5–15.5)
WBC: 10.2 K/uL (ref 4.0–10.5)

## 2024-04-23 LAB — COMPREHENSIVE METABOLIC PANEL WITH GFR
ALT: 28 U/L (ref 0–35)
AST: 25 U/L (ref 0–37)
Albumin: 4.7 g/dL (ref 3.5–5.2)
Alkaline Phosphatase: 64 U/L (ref 39–117)
BUN: 12 mg/dL (ref 6–23)
CO2: 29 meq/L (ref 19–32)
Calcium: 9.4 mg/dL (ref 8.4–10.5)
Chloride: 101 meq/L (ref 96–112)
Creatinine, Ser: 0.97 mg/dL (ref 0.40–1.20)
GFR: 65.61 mL/min (ref >60.00)
Glucose, Bld: 91 mg/dL (ref 70–99)
Potassium: 4.2 meq/L (ref 3.5–5.1)
Sodium: 140 meq/L (ref 135–145)
Total Bilirubin: 1.2 mg/dL (ref 0.2–1.2)
Total Protein: 7.4 g/dL (ref 6.0–8.3)

## 2024-04-23 LAB — LIPID PANEL
Cholesterol: 214 mg/dL — ABNORMAL HIGH (ref 0–200)
HDL: 51.1 mg/dL (ref >39.00)
LDL Cholesterol: 134 mg/dL — ABNORMAL HIGH (ref 0–99)
NonHDL: 162.82
Total CHOL/HDL Ratio: 4
Triglycerides: 145 mg/dL (ref 0.0–149.0)
VLDL: 29 mg/dL (ref 0.0–40.0)

## 2024-04-26 ENCOUNTER — Encounter: Payer: Self-pay | Admitting: Family Medicine

## 2024-04-26 LAB — CYTOLOGY - PAP
Adequacy: ABSENT
Comment: NEGATIVE
Diagnosis: NEGATIVE
High risk HPV: NEGATIVE

## 2024-04-27 ENCOUNTER — Encounter: Payer: Self-pay | Admitting: Family Medicine

## 2024-04-27 DIAGNOSIS — G4730 Sleep apnea, unspecified: Secondary | ICD-10-CM

## 2024-05-04 ENCOUNTER — Encounter: Payer: Self-pay | Admitting: Family Medicine

## 2024-05-04 DIAGNOSIS — F419 Anxiety disorder, unspecified: Secondary | ICD-10-CM

## 2024-05-04 MED ORDER — DULOXETINE HCL 60 MG PO CPEP: 60.0000 mg | ORAL_CAPSULE | Freq: Every day | ORAL | 3 refills | Status: AC

## 2024-05-06 ENCOUNTER — Encounter: Admitting: Family Medicine

## 2024-05-07 ENCOUNTER — Other Ambulatory Visit: Payer: Self-pay | Admitting: Medical Genetics

## 2024-05-07 DIAGNOSIS — Z006 Encounter for examination for normal comparison and control in clinical research program: Secondary | ICD-10-CM

## 2024-05-13 ENCOUNTER — Encounter: Payer: Self-pay | Admitting: Family Medicine

## 2024-06-07 LAB — GENECONNECT MOLECULAR SCREEN: Genetic Analysis Overall Interpretation: NEGATIVE
